# Patient Record
Sex: Male | Born: 1970
Health system: Southern US, Community
[De-identification: ages and names within clinical notes are randomized; demographics above are authoritative.]

---

## 2006-09-26 ENCOUNTER — Emergency Department: Payer: Self-pay | Admitting: Emergency Medicine

## 2006-09-26 ENCOUNTER — Other Ambulatory Visit: Payer: Self-pay

## 2008-02-19 ENCOUNTER — Ambulatory Visit: Payer: Self-pay | Admitting: Internal Medicine

## 2009-09-19 ENCOUNTER — Emergency Department: Payer: Self-pay | Admitting: Emergency Medicine

## 2017-12-28 ENCOUNTER — Ambulatory Visit: Payer: Self-pay | Admitting: Nurse Practitioner

## 2017-12-28 ENCOUNTER — Encounter: Payer: Self-pay | Admitting: Nurse Practitioner

## 2017-12-28 VITALS — BP 148/110 | HR 72 | Resp 16 | Ht >= 80 in | Wt 245.6 lb

## 2017-12-28 DIAGNOSIS — R030 Elevated blood-pressure reading, without diagnosis of hypertension: Secondary | ICD-10-CM

## 2017-12-28 DIAGNOSIS — L209 Atopic dermatitis, unspecified: Secondary | ICD-10-CM | POA: Insufficient documentation

## 2017-12-28 DIAGNOSIS — L237 Allergic contact dermatitis due to plants, except food: Secondary | ICD-10-CM

## 2017-12-28 DIAGNOSIS — B354 Tinea corporis: Secondary | ICD-10-CM

## 2017-12-28 MED ORDER — TRIAMCINOLONE ACETONIDE 0.025 % EX CREA
1.0000 | TOPICAL_CREAM | Freq: Two times a day (BID) | CUTANEOUS | 1 refills | Status: DC
Start: 2017-12-28 — End: 2019-05-20

## 2017-12-28 MED ORDER — MUPIROCIN 2 % EX OINT: TOPICAL_OINTMENT | CUTANEOUS | 1 refills | Status: DC

## 2017-12-28 MED ORDER — CLOTRIMAZOLE 1 % EX CREA: TOPICAL_CREAM | CUTANEOUS | 1 refills | Status: DC

## 2017-12-28 MED ORDER — PREDNISONE 10 MG (21) PO TBPK
ORAL_TABLET | ORAL | 0 refills | Status: DC
Start: 2017-12-28 — End: 2018-02-18

## 2017-12-28 NOTE — Progress Notes (Signed)
[facility] [address] [place], [place] [address]  Internal MEDICINE  Office Visit Note  Patient Name: Scott GrosserKevin Freeman  [id number]  [id number]  Date of Service: 12/28/2017  Chief Complaint  Patient presents with  . Poison Sumac    both wrists, left elbow, and abdomen     The patient is here for sick visit. He believes that he got into some poison sumak while doing some yard work. Started with very itchy and blistering rash on both wrists. Has gradually spread up the left arm. He has also noted a few new spots on the abdomen and on the upper legs. Itch terribly. Spots on the wrists are getting very tender. Some drainage noted from the rash on the wrists.   Pt is here for a sick visit.     Current Medication:  Outpatient Encounter Medications as of 12/28/2017  Medication Sig  . clotrimazole (LOTRIMIN) 1 % cream Apply to affected area in antecubital fossa BID for 10 days then as needed  . mupirocin ointment (BACTROBAN) 2 % Apply to affected areas on bilateral wrists BID for 10 days then as needed  . predniSONE (STERAPRED UNI-PAK 21 TAB) 10 MG (21) TBPK tablet 6 day taper - take by mouth as directed for 6 days  . triamcinolone (KENALOG) 0.025 % cream Apply 1 application topically 2 (two) times daily.   No facility-administered encounter medications on file as of 12/28/2017.       Medical History: History reviewed. No pertinent past medical history.   Today's Vitals   12/28/17 0916  BP: (!) 148/110  Pulse: 72  Resp: 16  SpO2: 99%  Weight: 245 lb 9.6 oz (111.4 kg)  Height: 6\' 9"  (2.057 m)    Review of Systems  Constitutional: Negative for activity change, chills, fatigue and unexpected weight change.  HENT: Negative for congestion, postnasal drip, rhinorrhea, sneezing and sore throat.   Eyes: Negative.  Negative for redness.  Respiratory: Negative for cough, chest tightness, shortness of breath and wheezing.   Cardiovascular: Negative for chest pain and  palpitations.       Elevated blood pressure today  Gastrointestinal: Negative for abdominal pain, constipation, diarrhea, nausea and vomiting.  Endocrine: Negative for cold intolerance, heat intolerance, polydipsia, polyphagia and polyuria.  Genitourinary: Negative.  Negative for dysuria and frequency.  Musculoskeletal: Negative for arthralgias, back pain, joint swelling and neck pain.  Skin: Positive for rash.       Blistering rash on the backs of the wrists. Fine red rash in crease of the left elbow.   Allergic/Immunologic: Negative for environmental allergies.  Neurological: Negative.  Negative for tremors and numbness.  Hematological: Negative for adenopathy. Does not bruise/bleed easily.  Psychiatric/Behavioral: Negative for behavioral problems (Depression), sleep disturbance and suicidal ideas. The patient is not nervous/anxious.     Physical Exam  Constitutional: He is oriented to person, place, and time. He appears well-developed and well-nourished. No distress.  HENT:  Head: Normocephalic and atraumatic.  Mouth/Throat: Oropharynx is clear and moist. No oropharyngeal exudate.  Eyes: Pupils are equal, round, and reactive to light. EOM are normal.  Neck: Normal range of motion. Neck supple. No JVD present. No tracheal deviation present. No thyromegaly present.  Cardiovascular: Normal rate, regular rhythm and normal heart sounds. Exam reveals no gallop and no friction rub.  No murmur heard. Pulmonary/Chest: Effort normal and breath sounds normal. No respiratory distress. He has no wheezes. He has no rales. He exhibits no tenderness.  Abdominal: Soft. Bowel sounds are  normal. There is no tenderness.  Musculoskeletal: Normal range of motion.  Lymphadenopathy:    He has no cervical adenopathy.  Neurological: He is alert and oriented to person, place, and time. No cranial nerve deficit.  Skin: Skin is warm and dry. Rash noted. He is not diaphoretic.     Psychiatric: He has a normal  mood and affect. His behavior is normal. Judgment and thought content normal.  Nursing note and vitals reviewed.  Assessment/Plan: 1. Poison oak dermatitis Recommend Zenfel wash for poison ivy/poison oak.  - triamcinolone (KENALOG) 0.025 % cream; Apply 1 application topically 2 (two) times daily.  Dispense: 80 g; Refill: 1 - predniSONE (STERAPRED UNI-PAK 21 TAB) 10 MG (21) TBPK tablet; 6 day taper - take by mouth as directed for 6 days  Dispense: 21 tablet; Refill: 0  2. Atopic dermatitis, unspecified type - triamcinolone (KENALOG) 0.025 % cream; Apply 1 application topically 2 (two) times daily.  Dispense: 80 g; Refill: 1 - mupirocin ointment (BACTROBAN) 2 %; Apply to affected areas on bilateral wrists BID for 10 days then as needed  Dispense: 22 g; Refill: 1  3. Tinea corporis - clotrimazole (LOTRIMIN) 1 % cream; Apply to affected area in antecubital fossa BID for 10 days then as needed  Dispense: 40 g; Refill: 1  4. Elevated blood pressure reading Likely due to discomfort of rash and lesions on arms. Will monitor closely.   General Counseling: Scott Freeman understanding of the findings of todays visit and agrees with plan of treatment. I have discussed any further diagnostic evaluation that may be needed or ordered today. We also reviewed his medications today. he has been encouraged to call the office with any questions or concerns that should arise related to todays visit.  This patient was seen by Vincent Gros, FNP- C in Collaboration with Dr Lyndon Code as a part of collaborative care agreement   Meds ordered this encounter  Medications  . triamcinolone (KENALOG) 0.025 % cream    Sig: Apply 1 application topically 2 (two) times daily.    Dispense:  80 g    Refill:  1    Order Specific Question:   Supervising Provider    Answer:   Lyndon Code [[id number]]  . predniSONE (STERAPRED UNI-PAK 21 TAB) 10 MG (21) TBPK tablet    Sig: 6 day taper - take by mouth as directed for 6  days    Dispense:  21 tablet    Refill:  0    Order Specific Question:   Supervising Provider    Answer:   Lyndon Code [[id number]]  . mupirocin ointment (BACTROBAN) 2 %    Sig: Apply to affected areas on bilateral wrists BID for 10 days then as needed    Dispense:  22 g    Refill:  1    Order Specific Question:   Supervising Provider    Answer:   Lyndon Code [[id number]]  . clotrimazole (LOTRIMIN) 1 % cream    Sig: Apply to affected area in antecubital fossa BID for 10 days then as needed    Dispense:  40 g    Refill:  1    Order Specific Question:   Supervising Provider    Answer:   Lyndon Code [[id number]]    Time spent: 15 Minutes

## 2018-02-18 ENCOUNTER — Encounter: Payer: Self-pay | Admitting: Nurse Practitioner

## 2018-02-18 ENCOUNTER — Ambulatory Visit: Payer: Self-pay | Admitting: Nurse Practitioner

## 2018-02-18 ENCOUNTER — Ambulatory Visit: Payer: BLUE CROSS/BLUE SHIELD | Admitting: Nurse Practitioner

## 2018-02-18 DIAGNOSIS — L237 Allergic contact dermatitis due to plants, except food: Secondary | ICD-10-CM

## 2018-02-18 MED ORDER — PREDNISONE 10 MG (21) PO TBPK
ORAL_TABLET | ORAL | 0 refills | Status: DC
Start: 2018-02-18 — End: 2018-02-26

## 2018-02-18 NOTE — Progress Notes (Signed)
[facility] [address] [place], [place] [address]  Internal MEDICINE  Office Visit Note  Patient Name: Scott Freeman  [id number]  [id number]  Date of Service: 02/18/2018  Chief Complaint  Patient presents with  . Poison Oak     The patient states that he was edging the fence around the yard of his home/office. Few remaining poison ivy/poison oak plants on the fence. Noted some small itchy lesions on Saturday evening. Starting to spread up the arms. Now noticing some on the lower legs as well. Has been trying not to scratch. These lesions are similar to poison ivy rash he has had in the past.   Pt is here for a sick visit.     Current Medication:  Outpatient Encounter Medications as of 02/18/2018  Medication Sig  . clotrimazole (LOTRIMIN) 1 % cream Apply to affected area in antecubital fossa BID for 10 days then as needed  . mupirocin ointment (BACTROBAN) 2 % Apply to affected areas on bilateral wrists BID for 10 days then as needed  . predniSONE (STERAPRED UNI-PAK 21 TAB) 10 MG (21) TBPK tablet 6 day taper - take by mouth as directed for 6 days  . triamcinolone (KENALOG) 0.025 % cream Apply 1 application topically 2 (two) times daily.  . [DISCONTINUED] predniSONE (STERAPRED UNI-PAK 21 TAB) 10 MG (21) TBPK tablet 6 day taper - take by mouth as directed for 6 days (Patient not taking: Reported on 02/18/2018)   No facility-administered encounter medications on file as of 02/18/2018.       Medical History: No past medical history on file.   Vital Signs: BP (!) 155/96   Pulse 66   Temp 98.2 F (36.8 C)   Resp 16   Ht  (2.057 m)   Wt 244 lb (110.7 kg)   SpO2 98%   BMI 26.15 kg/m    Review of Systems  Constitutional: Negative for activity change, chills, fatigue and unexpected weight change.  HENT: Negative for congestion, postnasal drip, rhinorrhea, sneezing and sore throat.   Eyes: Negative.  Negative for redness.  Respiratory: Negative for cough,  chest tightness, shortness of breath and wheezing.   Cardiovascular: Negative for chest pain and palpitations.       Elevated blood pressure today  Gastrointestinal: Negative for abdominal pain, constipation, diarrhea, nausea and vomiting.  Endocrine: Negative for cold intolerance, heat intolerance, polydipsia, polyphagia and polyuria.  Genitourinary: Negative.  Negative for dysuria and frequency.  Musculoskeletal: Negative for arthralgias, back pain, joint swelling and neck pain.  Skin: Positive for rash.       Widely dispersed red, round, lesions on the lower arms and lower legs. Very itchy. No evidence of blistering or infection at this time.   Allergic/Immunologic: Negative for environmental allergies.  Neurological: Negative.  Negative for tremors and numbness.  Hematological: Negative for adenopathy. Does not bruise/bleed easily.  Psychiatric/Behavioral: Negative for behavioral problems (Depression), sleep disturbance and suicidal ideas. The patient is not nervous/anxious.     Physical Exam  Constitutional: He is oriented to person, place, and time. He appears well-developed and well-nourished. No distress.  HENT:  Head: Normocephalic and atraumatic.  Mouth/Throat: Oropharynx is clear and moist. No oropharyngeal exudate.  Eyes: Pupils are equal, round, and reactive to light. EOM are normal.  Neck: Normal range of motion. Neck supple. No JVD present. No tracheal deviation present. No thyromegaly present.  Cardiovascular: Normal rate, regular rhythm and normal heart sounds. Exam reveals no gallop and no friction rub.  No murmur heard. Pulmonary/Chest: Effort normal. No respiratory distress. He has no wheezes. He has no rales. He exhibits no tenderness.  Abdominal: Soft. Bowel sounds are normal.  Musculoskeletal: Normal range of motion.  Lymphadenopathy:    He has no cervical adenopathy.  Neurological: He is alert and oriented to person, place, and time. No cranial nerve deficit.   Skin: Skin is warm and dry. Rash noted. He is not diaphoretic.  Widely dispersed maculopapular rash on the lower arms and lower legs. Comprised of small, red, round lesions. All currently intact with no drainage or evidence of infection present.   Psychiatric: He has a normal mood and affect. His behavior is normal. Judgment and thought content normal.  Nursing note and vitals reviewed.  Assessment/Plan: 1. Poison oak dermatitis Prednisone 6 day dose pack. Take as directed for 6 days. Use previously prescribed triamcinolone cream to help with itching and irritation.  - predniSONE (STERAPRED UNI-PAK 21 TAB) 10 MG (21) TBPK tablet; 6 day taper - take by mouth as directed for 6 days  Dispense: 21 tablet; Refill: 0  General Counseling: vishal sandlin understanding of the findings of todays visit and agrees with plan of treatment. I have discussed any further diagnostic evaluation that may be needed or ordered today. We also reviewed his medications today. he has been encouraged to call the office with any questions or concerns that should arise related to todays visit.  This patient was seen by Vincent Gros, FNP- C in Collaboration with Dr Lyndon Code as a part of collaborative care agreement    Meds ordered this encounter  Medications  . predniSONE (STERAPRED UNI-PAK 21 TAB) 10 MG (21) TBPK tablet    Sig: 6 day taper - take by mouth as directed for 6 days    Dispense:  21 tablet    Refill:  0    Order Specific Question:   Supervising Provider    Answer:   Lyndon Code [[id number]]    Time spent: 15 Minutes

## 2018-02-26 ENCOUNTER — Telehealth: Payer: Self-pay

## 2018-02-26 ENCOUNTER — Other Ambulatory Visit: Payer: Self-pay | Admitting: Nurse Practitioner

## 2018-02-26 DIAGNOSIS — L237 Allergic contact dermatitis due to plants, except food: Secondary | ICD-10-CM

## 2018-02-26 MED ORDER — PREDNISONE 10 MG (21) PO TBPK: ORAL_TABLET | ORAL | 0 refills | Status: DC

## 2018-02-26 NOTE — Progress Notes (Signed)
Patient called office today, c/o persistent poison ivy rash. Sent in second round prednisone 10mg, six day dose pack. Take as directed. Use hydrocortisone cream OTC to help alleviate itching and irritation. Did he try Xenfel wash?

## 2018-02-26 NOTE — Telephone Encounter (Signed)
Pt wife advised that we send pres to phar/np

## 2018-02-26 NOTE — Telephone Encounter (Signed)
Patient called office today, c/o persistent poison ivy rash. Sent in second round prednisone , six day dose pack. Take as directed. Use hydrocortisone cream OTC to help alleviate itching and irritation. Did he try Xenfel wash?

## 2018-12-20 ENCOUNTER — Emergency Department
Admission: EM | Admit: 2018-12-20 | Discharge: 2018-12-20 | Discharge status: Against Medical Advice | Payer: 59 | Attending: Emergency Medicine | Admitting: Emergency Medicine

## 2018-12-20 ENCOUNTER — Encounter: Payer: Self-pay | Admitting: *Deleted

## 2018-12-20 ENCOUNTER — Other Ambulatory Visit: Payer: Self-pay

## 2018-12-20 DIAGNOSIS — R509 Fever, unspecified: Secondary | ICD-10-CM | POA: Insufficient documentation

## 2018-12-20 DIAGNOSIS — Z5321 Procedure and treatment not carried out due to patient leaving prior to being seen by health care provider: Secondary | ICD-10-CM | POA: Diagnosis not present

## 2018-12-20 LAB — CBC WITH DIFFERENTIAL/PLATELET
ABS IMMATURE GRANULOCYTES: 0.03 10*3/uL (ref 0.00–0.07)
Basophils Absolute: 0 10*3/uL (ref 0.0–0.1)
Basophils Relative: 1 %
Eosinophils Absolute: 0.1 10*3/uL (ref 0.0–0.5)
Eosinophils Relative: 2 %
HCT: 44 % (ref 39.0–52.0)
Hemoglobin: 14.5 g/dL (ref 13.0–17.0)
Immature Granulocytes: 1 %
Lymphocytes Relative: 14 %
Lymphs Abs: 0.8 10*3/uL (ref 0.7–4.0)
MCH: 31.8 pg (ref 26.0–34.0)
MCHC: 33 g/dL (ref 30.0–36.0)
MCV: 96.5 fL (ref 80.0–100.0)
Monocytes Absolute: 0.7 10*3/uL (ref 0.1–1.0)
Monocytes Relative: 12 %
Neutro Abs: 4.1 10*3/uL (ref 1.7–7.7)
Neutrophils Relative %: 70 %
Platelets: 224 10*3/uL (ref 150–400)
RBC: 4.56 MIL/uL (ref 4.22–5.81)
RDW: 13.3 % (ref 11.5–15.5)
WBC: 5.8 10*3/uL (ref 4.0–10.5)
nRBC: 0 % (ref 0.0–0.2)

## 2018-12-20 LAB — COMPREHENSIVE METABOLIC PANEL
ALT: 35 U/L (ref 0–44)
AST: 23 U/L (ref 15–41)
Albumin: 4.6 g/dL (ref 3.5–5.0)
Alkaline Phosphatase: 74 U/L (ref 38–126)
Anion gap: 11 (ref 5–15)
BILIRUBIN TOTAL: 0.4 mg/dL (ref 0.3–1.2)
BUN: 15 mg/dL (ref 6–20)
CO2: 25 mmol/L (ref 22–32)
Calcium: 9.5 mg/dL (ref 8.9–10.3)
Chloride: 101 mmol/L (ref 98–111)
Creatinine, Ser: 1.21 mg/dL (ref 0.61–1.24)
GFR calc Af Amer: >60 mL/min (ref >60)
GFR calc non Af Amer: >60 mL/min (ref >60)
Glucose, Bld: 114 mg/dL — ABNORMAL HIGH (ref 70–99)
POTASSIUM: 3.9 mmol/L (ref 3.5–5.1)
Sodium: 137 mmol/L (ref 135–145)
Total Protein: 8.1 g/dL (ref 6.5–8.1)

## 2018-12-20 LAB — INFLUENZA PANEL BY PCR (TYPE A & B)
Influenza A By PCR: NEGATIVE
Influenza B By PCR: NEGATIVE

## 2018-12-20 MED ORDER — ACETAMINOPHEN 325 MG PO TABS
ORAL_TABLET | ORAL | Status: AC
  Filled 2018-12-20: qty 2

## 2018-12-20 MED ORDER — SODIUM CHLORIDE 0.9% FLUSH: 3.0000 mL | Freq: Once | INTRAVENOUS | Status: DC

## 2018-12-20 MED ORDER — ACETAMINOPHEN 325 MG PO TABS
650.0000 mg | ORAL_TABLET | Freq: Once | ORAL | Status: AC
  Administered 2018-12-20: 650 mg via ORAL

## 2018-12-20 NOTE — ED Notes (Signed)
Pt's and spouse to desk about wait ime. Pt's spouse stating that pt "is in excruciating pain and cannot wait anymore at this time". Pt and spouse leaving the ED at  This time with steady gait and in NAD.

## 2018-12-20 NOTE — ED Triage Notes (Signed)
Pt arrives with onset of fevers around 1900, dry cough, no n/v/d. Body aches. No meds for fever.

## 2018-12-23 ENCOUNTER — Telehealth (INDEPENDENT_AMBULATORY_CARE_PROVIDER_SITE_OTHER): Payer: 59 | Admitting: Internal Medicine

## 2018-12-23 ENCOUNTER — Ambulatory Visit (INDEPENDENT_AMBULATORY_CARE_PROVIDER_SITE_OTHER): Payer: 59 | Admitting: Internal Medicine

## 2018-12-23 ENCOUNTER — Ambulatory Visit: Payer: 59 | Admitting: Internal Medicine

## 2018-12-23 DIAGNOSIS — R059 Cough, unspecified: Secondary | ICD-10-CM

## 2018-12-23 DIAGNOSIS — J069 Acute upper respiratory infection, unspecified: Secondary | ICD-10-CM | POA: Diagnosis not present

## 2018-12-23 DIAGNOSIS — R509 Fever, unspecified: Secondary | ICD-10-CM

## 2018-12-23 DIAGNOSIS — R05 Cough: Secondary | ICD-10-CM | POA: Diagnosis not present

## 2018-12-23 MED ORDER — AZITHROMYCIN 250 MG PO TABS: ORAL_TABLET | ORAL | 0 refills | Status: DC

## 2018-12-23 NOTE — Telephone Encounter (Signed)
Spoke with patient concerning cough and fever, He went to ED, was tested for influenza which was negative, Pt was not seen at ED due to long wait time. Went home and took Claritin and Tylenol. He does have cough and minimum sob, He was able to carry out phone conversation without any pause. Does have allergies and pollen was bothering him. He denies any known  exposure to COVID-19. Spoke with wife as well. Following instruction are given 1. Rest and stay at home 2. Drink plenty of water. 3. Flonase and Claritin 4. Z-Pak called in

## 2018-12-24 ENCOUNTER — Ambulatory Visit: Payer: 59 | Admitting: Nurse Practitioner

## 2018-12-24 ENCOUNTER — Encounter: Payer: Self-pay | Admitting: Nurse Practitioner

## 2018-12-24 ENCOUNTER — Other Ambulatory Visit: Payer: Self-pay

## 2018-12-24 VITALS — BP 146/98 | HR 110 | Temp 101.9°F | Resp 16 | Ht >= 80 in | Wt 248.0 lb

## 2018-12-24 DIAGNOSIS — R05 Cough: Secondary | ICD-10-CM

## 2018-12-24 DIAGNOSIS — J069 Acute upper respiratory infection, unspecified: Secondary | ICD-10-CM

## 2018-12-24 DIAGNOSIS — R059 Cough, unspecified: Secondary | ICD-10-CM

## 2018-12-24 DIAGNOSIS — R509 Fever, unspecified: Secondary | ICD-10-CM | POA: Diagnosis not present

## 2018-12-24 LAB — POCT INFLUENZA A/B
Influenza A, POC: NEGATIVE
Influenza B, POC: NEGATIVE

## 2018-12-24 MED ORDER — HYDROCOD POLST-CPM POLST ER 10-8 MG/5ML PO SUER: 5.0000 mL | Freq: Two times a day (BID) | ORAL | 0 refills | Status: DC | PRN

## 2018-12-24 NOTE — Progress Notes (Signed)
Pt blood pressure is elevated informed provider Pt pulse elevated 1st reading 154/104 2nd reading 146/98

## 2018-12-24 NOTE — Progress Notes (Signed)
[facility] [address] [place], [place] [address]  Internal MEDICINE  Office Visit Note  Patient Name: Scott GrosserKevin Freeman  [id number]  [id number]  Date of Service: 12/25/2018   Pt is here for a sick visit.  Chief Complaint  Patient presents with  . Cough    pt has a bad cough, when talking, drank hot chocolate and it helped, has been going on for a little over a day or so   . Fever    saturday was 103, today was 99.     The patient c/o cough, congestion, and fever. Did go to ER on 3/20. Tested negative for flu. Was told to take OTC medication such as claritin and OTC cough suppressant. Was still feeling poorly yesterday. A z-pack was called in. Started this yesterday. Took two pills yesterday, one pill today. Feels better. No body aches or headache, or sore throat. Has severe cough without chest pain. Does get shortness of breath when coughing. Continues to have high fever. Does get better with tylenol.        Current Medication:  Outpatient Encounter Medications as of 12/24/2018  Medication Sig  . azithromycin (ZITHROMAX) 250 MG tablet Use as directed  . clotrimazole (LOTRIMIN) 1 % cream Apply to affected area in antecubital fossa BID for 10 days then as needed  . mupirocin ointment (BACTROBAN) 2 % Apply to affected areas on bilateral wrists BID for 10 days then as needed  . predniSONE (STERAPRED UNI-PAK 21 TAB) 10 MG (21) TBPK tablet 6 day taper - take by mouth as directed for 6 days  . triamcinolone (KENALOG) 0.025 % cream Apply 1 application topically 2 (two) times daily.  . chlorpheniramine-HYDROcodone (TUSSIONEX PENNKINETIC ER) 10-8 MG/5ML SUER Take 5 mLs by mouth every 12 (twelve) hours as needed for cough.   No facility-administered encounter medications on file as of 12/24/2018.       Medical History: History reviewed. No pertinent past medical history.   Today's Vitals   12/24/18 1541  BP: (!) 146/98  Pulse: (!) 110  Resp: 16  Temp: (!) 101.9 F  (38.8 C)  SpO2: 95%  Weight: 248 lb (112.5 kg)  Height: 6\' 9"  (2.057 m)   Body mass index is 26.58 kg/m.  Review of Systems  Constitutional: Positive for chills, fatigue and fever. Negative for unexpected weight change.  HENT: Positive for congestion and postnasal drip. Negative for rhinorrhea, sneezing and sore throat.   Respiratory: Negative for cough, chest tightness and shortness of breath.   Cardiovascular: Negative for chest pain and palpitations.  Gastrointestinal: Negative for abdominal pain, constipation, diarrhea, nausea and vomiting.  Musculoskeletal: Positive for myalgias. Negative for arthralgias, back pain, joint swelling and neck pain.  Skin: Negative for rash.  Neurological: Positive for headaches. Negative for tremors and numbness.  Hematological: Positive for adenopathy. Does not bruise/bleed easily.  Psychiatric/Behavioral: Negative for behavioral problems (Depression), sleep disturbance and suicidal ideas. The patient is not nervous/anxious.     Physical Exam Vitals signs and nursing note reviewed.  Constitutional:      General: He is not in acute distress.    Appearance: He is well-developed. He is ill-appearing. He is not diaphoretic.  HENT:     Head: Normocephalic and atraumatic.     Nose: Congestion present.     Right Turbinates: Swollen.     Left Turbinates: Swollen.     Mouth/Throat:     Pharynx: Posterior oropharyngeal erythema present. No oropharyngeal exudate.  Eyes:  Pupils: Pupils are equal, round, and reactive to light.  Neck:     Musculoskeletal: Normal range of motion and neck supple.     Thyroid: No thyromegaly.     Vascular: No JVD.     Trachea: No tracheal deviation.  Cardiovascular:     Rate and Rhythm: Normal rate and regular rhythm.     Heart sounds: Normal heart sounds. No murmur. No friction rub. No gallop.   Pulmonary:     Effort: Pulmonary effort is normal. No respiratory distress.     Breath sounds: Normal breath sounds. No  wheezing or rales.     Comments: Harsh, dry, non-productive cough present.  Chest:     Chest wall: No tenderness.  Abdominal:     General: Bowel sounds are normal.     Palpations: Abdomen is soft.  Musculoskeletal: Normal range of motion.  Lymphadenopathy:     Cervical: Cervical adenopathy present.  Skin:    General: Skin is warm and dry.  Neurological:     Mental Status: He is alert and oriented to person, place, and time.     Cranial Nerves: No cranial nerve deficit.  Psychiatric:        Behavior: Behavior normal.        Thought Content: Thought content normal.        Judgment: Judgment normal.   Assessment/Plan:  1. Acute upper respiratory infection Patient to finish antibiotics prescribed yesterday. He should rest and increase fluids. Continue to take OTC medication as needed and as indicated to alleviate symptoms.   2. Fever, unspecified fever cause Flu swab negative again today. Suspect COVID19. Reviewed current recommendations from [facility] --He should self-quarantine for 7 days and an additional 3 days after respiratory symptoms resolve. Everyone in his home should be vigilant about social distancing, and may benefit from self-quarantining for next 10 days. Take tylenol as needed for fever and headache. Contact PCP or ER if symptoms get worse or do not get better over next several days. Patient and his wife both understand recommendations.  - POCT Influenza A/B  3. Cough Attempted spirometry, however, poor quality due to severity and persistence of cough. Prescribed tussionex to take twice daily if needed for cough. Advised patient not to overuse this medicine and not to mix with other medications or alcohol as it can cause respiratory distress, sleepiness or dizziness. Should also avoid driving. Patient voiced understanding and agreement.  - Spirometry with Graph - POCT Influenza A/B - chlorpheniramine-HYDROcodone (TUSSIONEX PENNKINETIC ER) 10-8 MG/5ML SUER; Take 5 mLs by mouth  every 12 (twelve) hours as needed for cough.  Dispense: 115 mL; Refill: 0  General Counseling: Jasn verbalizes understanding of the findings of todays visit and agrees with plan of treatment. I have discussed any further diagnostic evaluation that may be needed or ordered today. We also reviewed his medications today. he has been encouraged to call the office with any questions or concerns that should arise related to todays visit.    Counseling:  Rest and increase fluids. Continue using OTC medication to control symptoms.   This patient was seen by Vincent Gros FNP Collaboration with Dr Lyndon Code as a part of collaborative care agreement  Orders Placed This Encounter  Procedures  . POCT Influenza A/B  . Spirometry with Graph    Meds ordered this encounter  Medications  . chlorpheniramine-HYDROcodone (TUSSIONEX PENNKINETIC ER) 10-8 MG/5ML SUER    Sig: Take 5 mLs by mouth every 12 (twelve) hours as needed for  cough.    Dispense:  115 mL    Refill:  0    Order Specific Question:   Supervising Provider    Answer:   Lyndon Code [[id number]]    Time spent: 30 Minutes

## 2018-12-25 DIAGNOSIS — J069 Acute upper respiratory infection, unspecified: Secondary | ICD-10-CM | POA: Insufficient documentation

## 2018-12-25 DIAGNOSIS — R05 Cough: Secondary | ICD-10-CM | POA: Insufficient documentation

## 2018-12-25 DIAGNOSIS — R509 Fever, unspecified: Secondary | ICD-10-CM | POA: Insufficient documentation

## 2018-12-25 DIAGNOSIS — R059 Cough, unspecified: Secondary | ICD-10-CM | POA: Insufficient documentation

## 2018-12-26 ENCOUNTER — Telehealth: Payer: Self-pay | Admitting: Internal Medicine

## 2018-12-26 NOTE — Telephone Encounter (Signed)
Spoke with his wife Pt is getting worse with cough, fever and has been very fatigued Pt has been seen in the office 3/24 Pt was advised to go to ED

## 2018-12-27 ENCOUNTER — Other Ambulatory Visit: Payer: Self-pay

## 2018-12-27 ENCOUNTER — Inpatient Hospital Stay
Admission: EM | Admit: 2018-12-27 | Discharge: 2019-01-02 | DRG: 177 | Disposition: A | Discharge status: Home / Self Care | Payer: 59 | Attending: Pulmonary Disease | Admitting: Pulmonary Disease

## 2018-12-27 ENCOUNTER — Encounter: Payer: Self-pay | Admitting: *Deleted

## 2018-12-27 ENCOUNTER — Emergency Department: Payer: 59

## 2018-12-27 DIAGNOSIS — R5383 Other fatigue: Secondary | ICD-10-CM | POA: Diagnosis not present

## 2018-12-27 DIAGNOSIS — A419 Sepsis, unspecified organism: Secondary | ICD-10-CM

## 2018-12-27 DIAGNOSIS — J189 Pneumonia, unspecified organism: Secondary | ICD-10-CM | POA: Diagnosis not present

## 2018-12-27 DIAGNOSIS — J9601 Acute respiratory failure with hypoxia: Secondary | ICD-10-CM | POA: Diagnosis not present

## 2018-12-27 DIAGNOSIS — Z833 Family history of diabetes mellitus: Secondary | ICD-10-CM

## 2018-12-27 DIAGNOSIS — D649 Anemia, unspecified: Secondary | ICD-10-CM | POA: Diagnosis present

## 2018-12-27 DIAGNOSIS — J9600 Acute respiratory failure, unspecified whether with hypoxia or hypercapnia: Secondary | ICD-10-CM

## 2018-12-27 DIAGNOSIS — J80 Acute respiratory distress syndrome: Secondary | ICD-10-CM | POA: Diagnosis not present

## 2018-12-27 DIAGNOSIS — Z800 Family history of malignant neoplasm of digestive organs: Secondary | ICD-10-CM

## 2018-12-27 DIAGNOSIS — N170 Acute kidney failure with tubular necrosis: Secondary | ICD-10-CM | POA: Diagnosis present

## 2018-12-27 DIAGNOSIS — J22 Unspecified acute lower respiratory infection: Secondary | ICD-10-CM | POA: Diagnosis not present

## 2018-12-27 DIAGNOSIS — R918 Other nonspecific abnormal finding of lung field: Secondary | ICD-10-CM | POA: Diagnosis not present

## 2018-12-27 DIAGNOSIS — Z720 Tobacco use: Secondary | ICD-10-CM | POA: Diagnosis not present

## 2018-12-27 DIAGNOSIS — R05 Cough: Secondary | ICD-10-CM | POA: Diagnosis not present

## 2018-12-27 DIAGNOSIS — N179 Acute kidney failure, unspecified: Secondary | ICD-10-CM | POA: Diagnosis not present

## 2018-12-27 DIAGNOSIS — R6520 Severe sepsis without septic shock: Secondary | ICD-10-CM | POA: Diagnosis not present

## 2018-12-27 DIAGNOSIS — J1289 Other viral pneumonia: Secondary | ICD-10-CM | POA: Diagnosis present

## 2018-12-27 DIAGNOSIS — R0902 Hypoxemia: Secondary | ICD-10-CM | POA: Diagnosis not present

## 2018-12-27 DIAGNOSIS — R0602 Shortness of breath: Secondary | ICD-10-CM | POA: Diagnosis not present

## 2018-12-27 DIAGNOSIS — B9729 Other coronavirus as the cause of diseases classified elsewhere: Secondary | ICD-10-CM | POA: Diagnosis not present

## 2018-12-27 DIAGNOSIS — R509 Fever, unspecified: Secondary | ICD-10-CM | POA: Diagnosis not present

## 2018-12-27 LAB — URINALYSIS, COMPLETE (UACMP) WITH MICROSCOPIC
Bacteria, UA: NONE SEEN
Bilirubin Urine: NEGATIVE
Glucose, UA: NEGATIVE mg/dL
Hgb urine dipstick: NEGATIVE
Ketones, ur: NEGATIVE mg/dL
Leukocytes,Ua: NEGATIVE
Nitrite: NEGATIVE
Protein, ur: NEGATIVE mg/dL
Specific Gravity, Urine: 1.019 (ref 1.005–1.030)
Squamous Epithelial / HPF: NONE SEEN (ref 0–5)
pH: 6 (ref 5.0–8.0)

## 2018-12-27 LAB — CBC WITH DIFFERENTIAL/PLATELET
Abs Immature Granulocytes: 0.01 10*3/uL (ref 0.00–0.07)
BASOS ABS: 0 10*3/uL (ref 0.0–0.1)
Basophils Relative: 0 %
Eosinophils Absolute: 0 10*3/uL (ref 0.0–0.5)
Eosinophils Relative: 0 %
HCT: 42.3 % (ref 39.0–52.0)
Hemoglobin: 14 g/dL (ref 13.0–17.0)
Immature Granulocytes: 0 %
Lymphocytes Relative: 31 %
Lymphs Abs: 1 10*3/uL (ref 0.7–4.0)
MCH: 31 pg (ref 26.0–34.0)
MCHC: 33.1 g/dL (ref 30.0–36.0)
MCV: 93.8 fL (ref 80.0–100.0)
Monocytes Absolute: 0.2 10*3/uL (ref 0.1–1.0)
Monocytes Relative: 7 %
NEUTROS PCT: 62 %
NRBC: 0 % (ref 0.0–0.2)
Neutro Abs: 2 10*3/uL (ref 1.7–7.7)
Platelets: 228 10*3/uL (ref 150–400)
RBC: 4.51 MIL/uL (ref 4.22–5.81)
RDW: 13.2 % (ref 11.5–15.5)
WBC: 3.3 10*3/uL — ABNORMAL LOW (ref 4.0–10.5)

## 2018-12-27 LAB — COMPREHENSIVE METABOLIC PANEL
ALT: 28 U/L (ref 0–44)
AST: 32 U/L (ref 15–41)
Albumin: 3.8 g/dL (ref 3.5–5.0)
Alkaline Phosphatase: 57 U/L (ref 38–126)
Anion gap: 11 (ref 5–15)
BUN: 13 mg/dL (ref 6–20)
CO2: 28 mmol/L (ref 22–32)
Calcium: 8.4 mg/dL — ABNORMAL LOW (ref 8.9–10.3)
Chloride: 98 mmol/L (ref 98–111)
Creatinine, Ser: 1.28 mg/dL — ABNORMAL HIGH (ref 0.61–1.24)
GFR calc Af Amer: >60 mL/min (ref >60)
GFR calc non Af Amer: >60 mL/min (ref >60)
Glucose, Bld: 108 mg/dL — ABNORMAL HIGH (ref 70–99)
POTASSIUM: 3.5 mmol/L (ref 3.5–5.1)
SODIUM: 137 mmol/L (ref 135–145)
Total Bilirubin: 0.6 mg/dL (ref 0.3–1.2)
Total Protein: 7.8 g/dL (ref 6.5–8.1)

## 2018-12-27 LAB — INFLUENZA PANEL BY PCR (TYPE A & B)
Influenza A By PCR: NEGATIVE
Influenza B By PCR: NEGATIVE

## 2018-12-27 LAB — LIPASE, BLOOD: Lipase: 27 U/L (ref 11–51)

## 2018-12-27 LAB — PROCALCITONIN: Procalcitonin: <0.1 ng/mL

## 2018-12-27 LAB — BRAIN NATRIURETIC PEPTIDE: B Natriuretic Peptide: 25 pg/mL (ref 0.0–100.0)

## 2018-12-27 LAB — LACTIC ACID, PLASMA: Lactic Acid, Venous: 1 mmol/L (ref 0.5–1.9)

## 2018-12-27 LAB — PROTIME-INR
INR: 0.9 (ref 0.8–1.2)
PROTHROMBIN TIME: 12.4 s (ref 11.4–15.2)

## 2018-12-27 MED ORDER — SODIUM CHLORIDE 0.9 % IV SOLN
2.0000 g | Freq: Once | INTRAVENOUS | Status: AC
  Administered 2018-12-27: 2 g via INTRAVENOUS
  Filled 2018-12-27: qty 20

## 2018-12-27 MED ORDER — VANCOMYCIN HCL 10 G IV SOLR
1500.0000 mg | Freq: Two times a day (BID) | INTRAVENOUS | Status: DC
  Administered 2018-12-28: 1500 mg via INTRAVENOUS
  Filled 2018-12-27 (×2): qty 1500

## 2018-12-27 MED ORDER — SODIUM CHLORIDE 0.9 % IV SOLN
2.0000 g | Freq: Three times a day (TID) | INTRAVENOUS | Status: DC
  Administered 2018-12-28 – 2018-12-30 (×7): 2 g via INTRAVENOUS
  Filled 2018-12-27 (×9): qty 2

## 2018-12-27 MED ORDER — SODIUM CHLORIDE 0.9 % IV BOLUS (SEPSIS)
2000.0000 mL | Freq: Once | INTRAVENOUS | Status: AC
  Administered 2018-12-27: 2000 mL via INTRAVENOUS

## 2018-12-27 MED ORDER — VANCOMYCIN HCL IN DEXTROSE 1-5 GM/200ML-% IV SOLN: 1000.0000 mg | Freq: Once | INTRAVENOUS | Status: DC

## 2018-12-27 MED ORDER — VANCOMYCIN HCL 10 G IV SOLR
2000.0000 mg | Freq: Once | INTRAVENOUS | Status: AC
  Administered 2018-12-27: 2000 mg via INTRAVENOUS
  Filled 2018-12-27: qty 2000

## 2018-12-27 MED ORDER — SODIUM CHLORIDE 0.9 % IV SOLN
500.0000 mg | Freq: Once | INTRAVENOUS | Status: AC
  Administered 2018-12-27: 500 mg via INTRAVENOUS
  Filled 2018-12-27: qty 500

## 2018-12-27 MED ORDER — ACETAMINOPHEN 325 MG PO TABS: 650.0000 mg | ORAL_TABLET | Freq: Once | ORAL | Status: DC

## 2018-12-27 MED ORDER — ACETAMINOPHEN 500 MG PO TABS
1000.0000 mg | ORAL_TABLET | Freq: Once | ORAL | Status: AC
  Administered 2018-12-27: 1000 mg via ORAL
  Filled 2018-12-27: qty 2

## 2018-12-27 NOTE — ED Notes (Signed)
Pt is diaphoretic and appears to be uncomfortable buit color is appropriate and cap refill is less than 3 seconds.

## 2018-12-27 NOTE — ED Notes (Signed)
Patient transported to X-ray 

## 2018-12-27 NOTE — ED Provider Notes (Signed)
[facility] Emergency Department Provider Note  ____________________________________________   First MD Initiated Contact with Patient 12/27/18 1957     (approximate)  I have reviewed the triage vital signs and the nursing notes.   HISTORY  Chief Complaint Fever and Cough     HPI Scott Freeman is a 48 y.o. male with medical history as listed below (no contributory past medical history) who presents for evaluation of gradually worsening symptoms for about a week.  This includes persistent fever, cough which is infrequently productive, increasing shortness of breath, general malaise, and body aches.  His symptoms started exactly a week ago and have steadily gotten worse.  He spoke with his primary care provider about 4 days ago and has been on 4 days of azithromycin but it does not help.  Symptoms are getting worse and are worse with exertion, slightly better with rest.  He does not have much appetite.  He denies neck pain and neck stiffness.  Although he has no known COVID-19 exposures, he is around people that do a lot of traveling  even though he has not personally travel to any high risk areas.  He has no history of lung disease.  No history of cardiac disease.  No history of blood clots in the legs nor the lungs.   History reviewed. No pertinent past medical history.  Patient Active Problem List   Diagnosis Date Noted  . Acute upper respiratory infection 12/25/2018  . Fever 12/25/2018  . Cough 12/25/2018  . Poison oak dermatitis 12/28/2017  . Atopic dermatitis 12/28/2017  . Tinea corporis 12/28/2017  . Elevated blood pressure reading 12/28/2017    History reviewed. No pertinent surgical history.  Prior to Admission medications   Medication Sig Start Date End Date Taking? Authorizing Provider  azithromycin (ZITHROMAX) 250 MG tablet Use as directed 12/23/18   Lyndon Code, MD  chlorpheniramine-HYDROcodone [facility] ER) 10-8 MG/5ML SUER  Take 5 mLs by mouth every 12 (twelve) hours as needed for cough. 12/24/18   Carlean Jews, NP  clotrimazole (LOTRIMIN) 1 % cream Apply to affected area in antecubital fossa BID for 10 days then as needed 12/28/17   Carlean Jews, NP  mupirocin ointment (BACTROBAN) 2 % Apply to affected areas on bilateral wrists BID for 10 days then as needed 12/28/17   Carlean Jews, NP  predniSONE (STERAPRED UNI-PAK 21 TAB) 10 MG (21) TBPK tablet 6 day taper - take by mouth as directed for 6 days 02/26/18   Carlean Jews, NP  triamcinolone (KENALOG) 0.025 % cream Apply 1 application topically 2 (two) times daily. 12/28/17   Carlean Jews, NP    Allergies Patient has no known allergies.  Family History  Problem Relation Age of Onset  . Diabetes Mother     Social History Social History   Tobacco Use  . Smoking status: Never Smoker  . Smokeless tobacco: Never Used  Substance Use Topics  . Alcohol use: Yes    Comment: occasionally   . Drug use: Never    Review of Systems Constitutional:    -- General malaise:  Yes    -- Myalgias/bodyaches:  Yes    -- Fever/chills:  Yes    --  Eyes: No visual changes. ENT:    --  Nasal congestion/runny nose:  No    --  Sore throat:  Yes    --  Earaches:  No  Cardiovascular:    --  Chest pain:  No  Worse with deep inspiration or movement:  not applicable   --  Syncope / passing out:  No  Respiratory:   --  Shortness of breath:  Yes    --  Cough:  Yes  Productive?:  Occasional   --  Wheezing:  No  Gastrointestinal:    --  Abdominal pain:  No    --  Nausea and/or vomiting:  No    --  Diarrhea:  No    --   Genitourinary: Negative for dysuria and hematuria. Musculoskeletal: No complaints at this time. Integumentary: Negative for rash, abrasions, and lacerations. Neurological:    --  Headache: Yes.    --  Focal weakness or numbness:  No Psychiatric: No acute concerns at this time.  ____________________________________________    PHYSICAL EXAM:  VITAL SIGNS: ED Triage Vitals  Enc Vitals Group     BP 12/27/18 1646 128/84     Pulse Rate 12/27/18 1646 99     Resp 12/27/18 1646 16     Temp 12/27/18 1646 (!) 101.7 F (38.7 C)     Temp Source 12/27/18 1646 Oral     SpO2 12/27/18 1646 96 %     Weight 12/27/18 1647 112.5 kg (248 lb)     Height 12/27/18 1647 2.057 m (6\' 9" )     Head Circumference --      Peak Flow --      Pain Score 12/27/18 1646 3     Pain Loc --      Pain Edu? --      Excl. in GC? --     Constitutional: Alert and oriented.  Ill-appearing but nontoxic. Eyes: Conjunctivae are normal.  Head: Atraumatic. Nose: No congestion/rhinnorhea. Mouth/Throat: Mucous membranes are moist. Neck: No stridor.  No meningeal signs.   Cardiovascular: No tachycardia, regular rhythm. Good peripheral circulation. Respiratory: No accessory muscle usage, no tachypnea, no wheezing.  88% SPO2 on room air at triage. Gastrointestinal: Soft and nontender. No distention.  Musculoskeletal: No lower extremity tenderness nor edema. No gross deformities of extremities. Neurologic:  Normal speech and language. No gross focal neurologic deficits are appreciated.  Skin:  Skin is warm, dry and intact. No rash noted. Psychiatric: Mood and affect are normal under the circumstances.  No acute/emergent concerns nor abnormalities.  ____________________________________________   LABS (all labs ordered are listed, but only abnormal results are displayed)  Labs Reviewed  COMPREHENSIVE METABOLIC PANEL - Abnormal; Notable for the following components:      Result Value   Glucose, Bld 108 (*)    Creatinine, Ser 1.28 (*)    Calcium 8.4 (*)    All other components within normal limits  CBC WITH DIFFERENTIAL/PLATELET - Abnormal; Notable for the following components:   WBC 3.3 (*)    All other components within normal limits  URINALYSIS, COMPLETE (UACMP) WITH MICROSCOPIC - Abnormal; Notable for the following components:   Color, Urine  YELLOW (*)    APPearance CLEAR (*)    All other components within normal limits  CULTURE, BLOOD (ROUTINE X 2)  CULTURE, BLOOD (ROUTINE X 2)  URINE CULTURE  NOVEL CORONAVIRUS, NAA (HOSPITAL ORDER, SEND-OUT TO REF LAB)  RESPIRATORY PANEL BY PCR  LACTIC ACID, PLASMA  LIPASE, BLOOD  BRAIN NATRIURETIC PEPTIDE  PROCALCITONIN  PROTIME-INR  LACTIC ACID, PLASMA  INFLUENZA PANEL BY PCR (TYPE A & B)   ____________________________________________  EKG  ED ECG REPORT I, Loleta Rose, the attending physician, personally viewed and interpreted this ECG.  Date: 12/27/2018 EKG  Time: 18: 36 Rate: 89 Rhythm: normal sinus rhythm QRS Axis: normal Intervals: normal ST/T Wave abnormalities: Inverted T waves in lead III, otherwise unremarkable. Narrative Interpretation: no evidence of acute ischemia  ____________________________________________  RADIOLOGY I, Loleta Rose, personally viewed and evaluated these images (plain radiographs) as part of my medical decision making, as well as reviewing the written report by the radiologist.  ED MD interpretation: Multifocal/multi lobar pneumonia.  Official radiology report(s): Dg Chest 1 View  Result Date: 12/27/2018 CLINICAL DATA:  Cough, fever and viral symptoms for over a week. Fever to 101.7 with dyspnea. Patient is exposed to other people traveled to [place] and [place]. EXAM: CHEST  1 VIEW COMPARISON:  None. FINDINGS: Borderline cardiomegaly. Nonaneurysmal thoracic aorta. Multifocal wedge shaped airspace disease with air bronchograms in the left mid to lower lung in particular with more confluent opacity in the right upper lobe are identified suspicious for multilobar pneumonia. Differential possibilities might include pulmonary infarcts, less likely viral pneumonia given the dense appearing consolidations. No acute osseous abnormality. IMPRESSION: Multilobar pulmonary consolidations with air bronchograms more likely to represent multifocal  bacterial pneumonia. Follow-up to resolution recommended. Electronically Signed   By: Tollie Eth M.D.   On: 12/27/2018 18:10    ____________________________________________   PROCEDURES   Procedure(s) performed (including Critical Care):  .Critical Care Performed by: Loleta Rose, MD Authorized by: Loleta Rose, MD   Critical care provider statement:    Critical care time (minutes):  30   Critical care time was exclusive of:  Separately billable procedures and treating other patients   Critical care was necessary to treat or prevent imminent or life-threatening deterioration of the following conditions:  Sepsis and respiratory failure   Critical care was time spent personally by me on the following activities:  Development of treatment plan with patient or surrogate, discussions with consultants, evaluation of patient's response to treatment, examination of patient, obtaining history from patient or surrogate, ordering and performing treatments and interventions, ordering and review of laboratory studies, ordering and review of radiographic studies, pulse oximetry, re-evaluation of patient's condition and review of old charts     ____________________________________________   INITIAL IMPRESSION / MDM / ASSESSMENT AND PLAN / ED COURSE  As part of my medical decision making, I reviewed the following data within the electronic MEDICAL RECORD NUMBER Nursing notes reviewed and incorporated, Labs reviewed , EKG interpreted , Old chart reviewed, Radiograph reviewed , Discussed with admitting physician (Dr. Nemiah Commander) and Notes from prior ED visits   Blu Lori was evaluated in Emergency Department on 12/27/2018 for the symptoms described in the history of present illness. He was evaluated in the context of the global COVID-19 pandemic, which necessitated consideration that the patient might be at risk for infection with the SARS-CoV-2 virus that causes COVID-19. Institutional protocols and  algorithms that pertain to the evaluation of patients at risk for COVID-19 are in a state of rapid change based on information released by regulatory bodies including the CDC and federal and state organizations. These policies and algorithms were followed during the patient's care in the ED.  ---------------------    Differential diagnosis includes, but is not limited to, viral illness, COVID-19, community-acquired pneumonia, less likely pulmonary embolism or ACS.  The patient is febrile and has a heart rate of greater than 90 upon arrival.  Additionally he is hypoxemic at 80% on room air and was placed on 2 L of oxygen by nasal cannula.  He meets sepsis criteria and I performed a full  sepsis work-up.  I also gave him 2 g of ceftriaxone IV, azithromycin 500 mg IV, and vancomycin 1 g IV given his recent influenza-like illness and concern for possible MRSA pneumonia.  His lab work is notable for a mild leukopenia of 3.3, slightly elevated creatinine of 1.28 (and I ordered 2 L of normal saline IV), a lactic acid of 1.0 which is reassuring and suggestive that he does not have severe sepsis, a normal pro calcitonin which is also reassuring.  Given the patient's contact with people to travel frequently, his multi lobar pneumonia in the setting of recent antibiotic treatment (azithromycin), his hypoxemia, and his meeting of sepsis criteria although not severe sepsis, I believe he needs admission.  I also discussed the case with Dr. Nemiah Commander with the hospitalist service and we agreed that he is high risk for COVID-19 given all of the signs and symptoms described above.  We have sent to coronavirus, respiratory viral panel, and influenza testing.  Of note the patient did test negative as an outpatient for influenza previously but I am repeating it for the sake of completion within our system (the 3 respiratory viral panels all use the same swab).  The patient understands and agrees with the plan.      ____________________________________________  FINAL CLINICAL IMPRESSION(S) / ED DIAGNOSES  Final diagnoses:  Sepsis, due to unspecified organism, unspecified whether acute organ dysfunction present [facility])  Multifocal pneumonia  Lower respiratory infection  Acute respiratory failure with hypoxemia (HCC)     MEDICATIONS GIVEN DURING THIS VISIT:  Medications  vancomycin (VANCOCIN) 2,000 mg in sodium chloride 0.9 % 500 mL IVPB (2,000 mg Intravenous New Bag/Given 12/27/18 1940)  acetaminophen (TYLENOL) tablet 1,000 mg (1,000 mg Oral Given 12/27/18 1724)  sodium chloride 0.9 % bolus 2,000 mL (2,000 mLs Intravenous New Bag/Given 12/27/18 1846)  cefTRIAXone (ROCEPHIN) 2 g in sodium chloride 0.9 % 100 mL IVPB (0 g Intravenous Stopped 12/27/18 1939)  azithromycin (ZITHROMAX) 500 mg in sodium chloride 0.9 % 250 mL IVPB (500 mg Intravenous New Bag/Given 12/27/18 1852)     ED Discharge Orders    None       Note:  This document was prepared using Dragon voice recognition software and may include unintentional dictation errors.   Loleta Rose, MD 12/27/18 2040

## 2018-12-27 NOTE — ED Notes (Signed)
ED TO INPATIENT HANDOFF REPORT  ED Nurse Name and Phone #: Idalia Needle [phone]/Rebecca [phone]  S Name/Age/Gender Scott Freeman 48 y.o. male Room/Bed: ED33A/ED33A  Code Status   Code Status: Not on file  Home/SNF/Other Home Patient oriented to: self, place, time and situation Is this baseline? Yes   Triage Complete: Triage complete  Chief Complaint Cough  Triage Note Pt to ED reporting cough, fever and viral symptoms for over a week. Pt has tested negative for the flu twice. Pt took tylenol at 1640 and continues to ave a fever of 101.7 in the tent. SOB reported after cough but no increased WOB noted at this time. Pt has been around people that traveled to [place] and to [place]. No traveling himself.    Allergies No Known Allergies  Level of Care/Admitting Diagnosis ED Disposition    ED Disposition Condition Comment   Admit  Hospital Area: [facility] [[id number]]  Level of Care: Med-Surg [16]  Diagnosis: Pneumonia [227785]  Admitting Physician: Enid Baas [[id number]]  Attending Physician: Enid Baas [[id number]]  Estimated length of stay: past midnight tomorrow  Certification:: I certify this patient will need inpatient services for at least 2 midnights  Bed request comments: COVID- high risk suspicion  PT Class (Do Not Modify): Inpatient [101]  PT Acc Code (Do Not Modify): Private [1]       B Medical/Surgery History History reviewed. No pertinent past medical history. History reviewed. No pertinent surgical history.   A IV Location/Drains/Wounds Patient Lines/Drains/Airways Status   Active Line/Drains/Airways    Name:   Placement date:   Placement time:   Site:   Days:   Peripheral IV 12/27/18 Left Antecubital   12/27/18    1845    Antecubital   less than 1   Peripheral IV 12/27/18 Left Forearm   12/27/18    1845    Forearm   less than 1          Intake/Output Last 24 hours No intake or output data in the 24 hours ending 12/27/18  2355  Labs/Imaging Results for orders placed or performed during the hospital encounter of 12/27/18 (from the past 48 hour(s))  Lactic acid, plasma     Status: None   Collection Time: 12/27/18  6:34 PM  Result Value Ref Range   Lactic Acid, Venous 1.0 0.5 - 1.9 mmol/L    Comment: Performed at [facility], [address] [place], [place] [address]  Comprehensive metabolic panel     Status: Abnormal   Collection Time: 12/27/18  6:34 PM  Result Value Ref Range   Sodium 137 135 - 145 mmol/L   Potassium 3.5 3.5 - 5.1 mmol/L   Chloride 98 98 - 111 mmol/L   CO2 28 22 - 32 mmol/L   Glucose, Bld 108 (H) 70 - 99 mg/dL   BUN 13 6 - 20 mg/dL   Creatinine, Ser 6.54 (H) 0.61 - 1.24 mg/dL   Calcium 8.4 (L) 8.9 - 10.3 mg/dL   Total Protein 7.8 6.5 - 8.1 g/dL   Albumin 3.8 3.5 - 5.0 g/dL   AST 32 15 - 41 U/L   ALT 28 0 - 44 U/L   Alkaline Phosphatase 57 38 - 126 U/L   Total Bilirubin 0.6 0.3 - 1.2 mg/dL   GFR calc non Af Amer >60 >60 mL/min   GFR calc Af Amer >60 >60 mL/min   Anion gap 11 5 - 15    Comment: Performed at [facility],  [address] [place], [place] [address]  Lipase, blood     Status: None   Collection Time: 12/27/18  6:34 PM  Result Value Ref Range   Lipase 27 11 - 51 U/L    Comment: Performed at [facility], [address] [place], [place] [address]  Brain natriuretic peptide - IF patient is dyspneic     Status: None   Collection Time: 12/27/18  6:34 PM  Result Value Ref Range   B Natriuretic Peptide 25.0 0.0 - 100.0 pg/mL    Comment: Performed at [facility], [address] [place], [place] [address]  CBC WITH DIFFERENTIAL     Status: Abnormal   Collection Time: 12/27/18  6:34 PM  Result Value Ref Range   WBC 3.3 (L) 4.0 - 10.5 K/uL   RBC 4.51 4.22 - 5.81 MIL/uL   Hemoglobin 14.0 13.0 - 17.0 g/dL   HCT 25.0 03.7 - 04.8 %   MCV 93.8 80.0 - 100.0 fL   MCH 31.0 26.0 - 34.0 pg   MCHC 33.1 30.0 - 36.0 g/dL   RDW 88.9 16.9  - 45.0 %   Platelets 228 150 - 400 K/uL   nRBC 0.0 0.0 - 0.2 %   Neutrophils Relative % 62 %   Neutro Abs 2.0 1.7 - 7.7 K/uL   Lymphocytes Relative 31 %   Lymphs Abs 1.0 0.7 - 4.0 K/uL   Monocytes Relative 7 %   Monocytes Absolute 0.2 0.1 - 1.0 K/uL   Eosinophils Relative 0 %   Eosinophils Absolute 0.0 0.0 - 0.5 K/uL   Basophils Relative 0 %   Basophils Absolute 0.0 0.0 - 0.1 K/uL   Immature Granulocytes 0 %   Abs Immature Granulocytes 0.01 0.00 - 0.07 K/uL    Comment: Performed at [facility], [address] [place], [place] [address]  Procalcitonin     Status: None   Collection Time: 12/27/18  6:34 PM  Result Value Ref Range   Procalcitonin <0.10 ng/mL    Comment:        Interpretation: PCT (Procalcitonin) <= 0.5 ng/mL: Systemic infection (sepsis) is not likely. Local bacterial infection is possible. (NOTE)       Sepsis PCT Algorithm           Lower Respiratory Tract                                      Infection PCT Algorithm    ----------------------------     ----------------------------         PCT < 0.25 ng/mL                PCT < 0.10 ng/mL         Strongly encourage             Strongly discourage   discontinuation of antibiotics    initiation of antibiotics    ----------------------------     -----------------------------       PCT 0.25 - 0.50 ng/mL            PCT 0.10 - 0.25 ng/mL               OR       >80% decrease in PCT            Discourage initiation of  antibiotics      Encourage discontinuation           of antibiotics    ----------------------------     -----------------------------         PCT >= 0.50 ng/mL              PCT 0.26 - 0.50 ng/mL               AND        <80% decrease in PCT             Encourage initiation of                                             antibiotics       Encourage continuation           of antibiotics    ----------------------------     -----------------------------         PCT >= 0.50 ng/mL                  PCT > 0.50 ng/mL               AND         increase in PCT                  Strongly encourage                                      initiation of antibiotics    Strongly encourage escalation           of antibiotics                                     -----------------------------                                           PCT <= 0.25 ng/mL                                                 OR                                        > 80% decrease in PCT                                     Discontinue / Do not initiate                                             antibiotics Performed at [facility], [address], [place], [place] [address]   Protime-INR     Status: None   Collection Time: 12/27/18  6:34  PM  Result Value Ref Range   Prothrombin Time 12.4 11.4 - 15.2 seconds   INR 0.9 0.8 - 1.2    Comment: (NOTE) INR goal varies based on device and disease states. Performed at [facility], [address] [place], [place] [address]   Urinalysis, Complete w Microscopic     Status: Abnormal   Collection Time: 12/27/18  6:34 PM  Result Value Ref Range   Color, Urine YELLOW (A) YELLOW   APPearance CLEAR (A) CLEAR   Specific Gravity, Urine 1.019 1.005 - 1.030   pH 6.0 5.0 - 8.0   Glucose, UA NEGATIVE NEGATIVE mg/dL   Hgb urine dipstick NEGATIVE NEGATIVE   Bilirubin Urine NEGATIVE NEGATIVE   Ketones, ur NEGATIVE NEGATIVE mg/dL   Protein, ur NEGATIVE NEGATIVE mg/dL   Nitrite NEGATIVE NEGATIVE   Leukocytes,Ua NEGATIVE NEGATIVE   RBC / HPF 0-5 0 - 5 RBC/hpf   WBC, UA 0-5 0 - 5 WBC/hpf   Bacteria, UA NONE SEEN NONE SEEN   Squamous Epithelial / LPF NONE SEEN 0 - 5   Mucus PRESENT     Comment: Performed at [facility], [address] [place], [place] [address]  Influenza panel by PCR (type A & B)     Status: None   Collection Time: 12/27/18  6:50 PM  Result Value Ref Range   Influenza A By PCR NEGATIVE NEGATIVE    Influenza B By PCR NEGATIVE NEGATIVE    Comment: Performed at [facility], [address] [place], [place] [address]   Dg Chest 1 View  Result Date: 12/27/2018 CLINICAL DATA:  Cough, fever and viral symptoms for over a week. Fever to 101.7 with dyspnea. Patient is exposed to other people traveled to [place] and [place]. EXAM: CHEST  1 VIEW COMPARISON:  None. FINDINGS: Borderline cardiomegaly. Nonaneurysmal thoracic aorta. Multifocal wedge shaped airspace disease with air bronchograms in the left mid to lower lung in particular with more confluent opacity in the right upper lobe are identified suspicious for multilobar pneumonia. Differential possibilities might include pulmonary infarcts, less likely viral pneumonia given the dense appearing consolidations. No acute osseous abnormality. IMPRESSION: Multilobar pulmonary consolidations with air bronchograms more likely to represent multifocal bacterial pneumonia. Follow-up to resolution recommended. Electronically Signed   By: Tollie Ethavid  Kwon M.D.   On: 12/27/2018 18:10    Pending Labs Unresulted Labs (From admission, onward)    Start     Ordered   12/27/18 1836  Respiratory Panel by PCR  (Respiratory virus panel with precautions)  Once,   STAT    Question:  Patient immune status  Answer:  Normal   12/27/18 1837   12/27/18 1835  Novel Coronavirus, NAA (hospital order; send-out to ref lab)  (Novel Coronavirus, NAA [facility](Hospital Order; send-out to ref lab) with precautions panel)  Once,   STAT    Comments:  recent travel to [place] and other areas at risk, persistent viral syndrome for >1 week, now with multilobar pneumonia. Discussed with admitting physician (Dr. Tobi BastosPyreddy) and ED physician (Dr. York CeriseForbach), agreed testing was appropriate.   Question Answer Comment  Current symptoms Fever and Shortness of breath   Excluded other viral illnesses Yes   Exposure Risk None   Patient immune status Normal      12/27/18 1837   12/27/18 1828   Blood Culture (routine x 2)  BLOOD CULTURE X 2,   STAT     12/27/18 1830   12/27/18 1828  Urine culture  Add-on,   AD     12/27/18 1830   Signed and Held  Novel Coronavirus, NAA (hospital order; send-out to ref lab)  ([facility] IP COVID ADMIT NOVEL CORONAVIRUS, NAA (HOSPITAL ORDER; SEND-OUT TO REF LAB))  Once,   R    Question Answer Comment  Current symptoms Fever and Cough   Excluded other viral illnesses Yes   Exposure Risk None      Signed and Held   Signed and Held  MRSA PCR Screening  Once,   R     Signed and Held          Vitals/Pain Today's Vitals   12/27/18 1813 12/27/18 1822 12/27/18 1840 12/27/18 2154  BP: 128/80  128/84 124/69  Pulse: 93  84 86  Resp: Temp: (!) 101.5 F (38.6 C)  100.3 F (37.9 C) (!) 101.6 F (38.7 C)  TempSrc: Oral  Oral Oral  SpO2: 93%  93% 99%  Weight:      Height:      PainSc:  3       Isolation Precautions Droplet and Contact precautions  Medications Medications  ceFEPIme (MAXIPIME) 2 g in sodium chloride 0.9 % 100 mL IVPB (has no administration in time range)  vancomycin (VANCOCIN) 1,500 mg in sodium chloride 0.9 % 500 mL IVPB (has no administration in time range)  acetaminophen (TYLENOL) tablet 1,000 mg (1,000 mg Oral Given 12/27/18 1724)  sodium chloride 0.9 % bolus 2,000 mL (0 mLs Intravenous Stopped 12/27/18 2121)  cefTRIAXone (ROCEPHIN) 2 g in sodium chloride 0.9 % 100 mL IVPB (0 g Intravenous Stopped 12/27/18 1939)  azithromycin (ZITHROMAX) 500 mg in sodium chloride 0.9 % 250 mL IVPB (0 mg Intravenous Stopped 12/27/18 2121)  vancomycin (VANCOCIN) 2,000 mg in sodium chloride 0.9 % 500 mL IVPB (0 mg Intravenous Stopped 12/27/18 2157)    Mobility walks Low fall risk   Focused Assessments Pulmonary Assessment Handoff:  Lung sounds: Bilateral Breath Sounds: Clear L Breath Sounds: Clear R Breath Sounds: Clear O2 Device: Room Air        R Recommendations: See Admitting Provider Note  Report given to:   Additional  Notes: PT is 6 ft 9 inches, may want to extend bed

## 2018-12-27 NOTE — ED Notes (Signed)
Pt ambulatory to BR at this time and given meal tray.

## 2018-12-27 NOTE — Progress Notes (Signed)
Pharmacy Antibiotic Note  Scott Freeman is a 48 y.o. male admitted on 12/27/2018 with pneumonia.  Pharmacy has been consulted for Vancomycin dosing.  Plan: Will dose 2000mg  as a loading dose and await hospitalist consult for further dosing  Height: 6\' 9"  (205.7 cm) Weight: 248 lb (112.5 kg) IBW/kg (Calculated) : 98.3  Temp (24hrs), Avg:101.2 F (38.4 C), Min:100.3 F (37.9 C), Max:101.7 F (38.7 C)  Recent Labs  Lab 12/20/18 2051 12/27/18 1834  WBC 5.8  --   CREATININE 1.21  --   LATICACIDVEN  --  1.0    Estimated Creatinine Clearance: 102.7 mL/min (by C-G formula based on SCr of 1.21 mg/dL).    No Known Allergies  Antimicrobials this admission: 3/27 Azithromycin >>  3/27 Ceftriaxone >>  3/27 Vancomycin >>  Dose adjustments this admission: None  Microbiology results: 3/27 BCx: pending 3/27 UCx: pending  3/27 Respiratory panel: pending  3/27 COVID-19: pending  Thank you for allowing pharmacy to be a part of this patient's care.  Albina Billet, PharmD, BCPS Clinical Pharmacist 12/27/2018 7:16 PM

## 2018-12-27 NOTE — Consult Note (Signed)
CODE SEPSIS - PHARMACY COMMUNICATION  **Broad Spectrum Antibiotics should be administered within 1 hour of Sepsis diagnosis**  Time Code Sepsis Called/Page Received: 6629  Antibiotics Ordered: Vancomycin/Ceftriaxone/Azithromycin  Time of 1st antibiotic administration: 1847  Additional action taken by pharmacy: None  If necessary, Name of Provider/Nurse Contacted: N/A   Albina Billet, PharmD, BCPS Clinical Pharmacist 12/27/2018 7:05 PM

## 2018-12-27 NOTE — ED Notes (Signed)
Pt updated on delay for admission. Pt given warm blankets and pillow at this time. No further concerns expressed by pt to this RN.

## 2018-12-27 NOTE — Progress Notes (Signed)
Pharmacy Antibiotic Note  Scott Freeman is a 48 y.o. male admitted on 12/27/2018 with sepsis.  Pharmacy has been consulted for Vancomycin, Cefepime dosing.  Plan: Ceftriaxone 2 gm IV X 1 given in ED on 3/27 @ 1939. Cefepime 2 gm IV Q8H ordered to start on 3/28 @ 0300.  Vancomycin 2000 mg IV X 1 given in ED on 3/27 @ 1940.  Vancomycin 1500 mg IV Q12H ordered to start 3/28 @ 0800.  AUC = 504 CrCl = 83 ml/min Ke = 0.073 hr-1 T1/2 = 9.4 hrs  No Vanc peak or trough ordered yet.   Height: 6\' 9"  (205.7 cm) Weight: 248 lb (112.5 kg) IBW/kg (Calculated) : 98.3  Temp (24hrs), Avg:101.2 F (38.4 C), Min:100.3 F (37.9 C), Max:101.7 F (38.7 C)  Recent Labs  Lab 12/27/18 1834  WBC 3.3*  CREATININE 1.28*  LATICACIDVEN 1.0    Estimated Creatinine Clearance: 97.1 mL/min (A) (by C-G formula based on SCr of 1.28 mg/dL (H)).    No Known Allergies  Antimicrobials this admission:   >>    >>   Dose adjustments this admission:   Microbiology results:  BCx:   UCx:    Sputum:    MRSA PCR:   Thank you for allowing pharmacy to be a part of this patient's care.  Rachelann Enloe D 12/27/2018 9:32 PM

## 2018-12-27 NOTE — ED Notes (Signed)
Pts fever has not responded to medication and oxygen saturation has decreased. No increased WOB noted but pt appears very uncomfortable at this time.

## 2018-12-27 NOTE — ED Triage Notes (Signed)
Pt to ED reporting cough, fever and viral symptoms for over a week. Pt has tested negative for the flu twice. Pt took tylenol at 1640 and continues to ave a fever of 101.7 in the tent. SOB reported after cough but no increased WOB noted at this time. Pt has been around people that traveled to [place] and to [place]. No traveling himself.

## 2018-12-27 NOTE — ED Notes (Signed)
ED Provider at bedside. 

## 2018-12-27 NOTE — H&P (Signed)
[facility] [place] [facility]   PATIENT NAME: Scott Freeman    MR#:  [id number]  DATE OF BIRTH:  [birth year]  DATE OF ADMISSION:  12/27/2018  PRIMARY CARE PHYSICIAN: Carlean Jews, NP   REQUESTING/REFERRING PHYSICIAN: Dr. Loleta Rose  CHIEF COMPLAINT:   Chief Complaint  Patient presents with  . Fever  . Cough    HISTORY OF PRESENT ILLNESS:  Scott Freeman  is a 48 y.o. male with no significant past medical history presents to hospital secondary to 1 week history of fevers and cough and shortness of breath. Patient denies any recent travel.  Has been working and been in contact with friends who have recently traveled to [place] and [place].  He has been having fevers, chills, body aches for almost a week now.  Was in the ER, flu test was done and it was negative last week.  He left from the ER due to long waiting time at the time.  Has been self isolating himself at home.  Since his symptoms did not improve and now he is having dry cough and shortness of breath, 2 days ago he was seen by nurse practitioner at his PCPs office and was started on azithromycin.  Flu test was repeated at the time which was negative again.  There was a suspicion for possible cold with and so he was asked to self isolate.  Both he and his wife came to the emergency room as patient's today.  His wife's complaints were more dizziness and hypotension whereas the patient continues to have fevers, cough and shortness of breath.  He was hypoxic with sats 88% on room air on presentation.  Had a fever of 104 F at home and currently at 38 F here. He is leukopenic, low procalcitonin level.  LFTs are within normal limits.  Chest x-ray showing multilobar pulmonary consolidations with air bronchograms, likely bacterial pneumonia. He is being treated for pneumonia, and also high suspicion for COVID 19.  PAST MEDICAL HISTORY:  History reviewed. No pertinent past medical history.  PAST SURGICAL  HISTORY:  History reviewed. No pertinent surgical history.  SOCIAL HISTORY:   Social History   Tobacco Use  . Smoking status: Never Smoker  . Smokeless tobacco: Never Used  Substance Use Topics  . Alcohol use: Yes    Comment: occasionally     FAMILY HISTORY:   Family History  Problem Relation Age of Onset  . Diabetes Mother   . Pancreatic cancer Father     DRUG ALLERGIES:  No Known Allergies  REVIEW OF SYSTEMS:   Review of Systems  Constitutional: Positive for chills, fever and malaise/fatigue. Negative for weight loss.  HENT: Negative for ear discharge, ear pain, hearing loss and nosebleeds.   Eyes: Negative for blurred vision, double vision and photophobia.  Respiratory: Positive for cough and shortness of breath. Negative for hemoptysis and wheezing.   Cardiovascular: Negative for chest pain, palpitations, orthopnea and leg swelling.  Gastrointestinal: Positive for nausea. Negative for abdominal pain, constipation, diarrhea, heartburn, melena and vomiting.  Genitourinary: Negative for dysuria, frequency, hematuria and urgency.  Musculoskeletal: Positive for myalgias. Negative for back pain and neck pain.  Skin: Negative for rash.  Neurological: Negative for dizziness, tingling, tremors, sensory change, speech change, focal weakness and headaches.  Endo/Heme/Allergies: Does not bruise/bleed easily.  Psychiatric/Behavioral: Negative for depression.    MEDICATIONS AT HOME:   Prior to Admission medications   Medication Sig Start Date End Date Taking? Authorizing Provider  azithromycin (ZITHROMAX) 250 MG tablet Use as directed 12/23/18   Lyndon Code, MD  chlorpheniramine-HYDROcodone [facility]) 10-8 MG/5ML SUER Take 5 mLs by mouth every 12 (twelve) hours as needed for cough. 12/24/18   Carlean Jews, NP  clotrimazole (LOTRIMIN) 1 % cream Apply to affected area in antecubital fossa BID for 10 days then as needed 12/28/17   Carlean Jews, NP   mupirocin ointment (BACTROBAN) 2 % Apply to affected areas on bilateral wrists BID for 10 days then as needed 12/28/17   Carlean Jews, NP  predniSONE (STERAPRED UNI-PAK 21 TAB) 10 MG (21) TBPK tablet 6 day taper - take by mouth as directed for 6 days 02/26/18   Carlean Jews, NP  triamcinolone (KENALOG) 0.025 % cream Apply 1 application topically 2 (two) times daily. 12/28/17   Carlean Jews, NP      VITAL SIGNS:  Blood pressure 128/84, pulse 84, temperature 100.3 F (37.9 C), temperature source Oral, resp. rate 18, height 6\' 9"  (2.057 m), weight 112.5 kg, SpO2 93 %.  PHYSICAL EXAMINATION:   Physical Exam  GENERAL:  48 y.o.-year-old patient lying in the bed, appears to be in distress secondary to breathing and cough. EYES: Pupils equal, round, reactive to light and accommodation. No scleral icterus. Extraocular muscles intact.  HEENT: Head atraumatic, normocephalic. Oropharynx and nasopharynx clear.  NECK:  Supple, no jugular venous distention. No thyroid enlargement, no tenderness.  LUNGS: Normal breath sounds bilaterally, no wheezing, rales,rhonchi or crepitation. No use of accessory muscles of respiration.  Decreased bibasilar breath sounds. CARDIOVASCULAR: S1, S2 normal. No murmurs, rubs, or gallops.  ABDOMEN: Soft, nontender, nondistended. Bowel sounds present. No organomegaly or mass.  EXTREMITIES: No pedal edema, cyanosis, or clubbing.  NEUROLOGIC: Cranial nerves II through XII are intact. Muscle strength 5/5 in all extremities. Sensation intact. Gait not checked.  PSYCHIATRIC: The patient is alert and oriented x 3.  SKIN: No obvious rash, lesion, or ulcer.   LABORATORY PANEL:   CBC Recent Labs  Lab 12/27/18 1834  WBC 3.3*  HGB 14.0  HCT 42.3  PLT 228   ------------------------------------------------------------------------------------------------------------------  Chemistries  Recent Labs  Lab 12/27/18 1834  NA 137  K 3.5  CL 98  CO2 28  GLUCOSE  108*  BUN 13  CREATININE 1.28*  CALCIUM 8.4*  AST 32  ALT 28  ALKPHOS 57  BILITOT 0.6   ------------------------------------------------------------------------------------------------------------------  Cardiac Enzymes No results for input(s): TROPONINI in the last 168 hours. ------------------------------------------------------------------------------------------------------------------  RADIOLOGY:  Dg Chest 1 View  Result Date: 12/27/2018 CLINICAL DATA:  Cough, fever and viral symptoms for over a week. Fever to 101.7 with dyspnea. Patient is exposed to other people traveled to [place] and [place]. EXAM: CHEST  1 VIEW COMPARISON:  None. FINDINGS: Borderline cardiomegaly. Nonaneurysmal thoracic aorta. Multifocal wedge shaped airspace disease with air bronchograms in the left mid to lower lung in particular with more confluent opacity in the right upper lobe are identified suspicious for multilobar pneumonia. Differential possibilities might include pulmonary infarcts, less likely viral pneumonia given the dense appearing consolidations. No acute osseous abnormality. IMPRESSION: Multilobar pulmonary consolidations with air bronchograms more likely to represent multifocal bacterial pneumonia. Follow-up to resolution recommended. Electronically Signed   By: Tollie Eth M.D.   On: 12/27/2018 18:10    EKG:   Orders placed or performed during the hospital encounter of 12/27/18  . EKG 12-Lead  . EKG 12-Lead    IMPRESSION AND PLAN:   Scott Freeman  is a 48 y.o. male with no significant past medical history presents to hospital secondary to 1 week history of fevers and cough and shortness of breath.  1.  Acute hypoxic respiratory Failure-chest x-ray with multilobar pneumonia. -High risk for COVID-19, with fevers, negative flu test, negative procalcitonin, leukopenia.  Negative respiratory virus panel -COVID-19 test has been sent out -Airborne and contact isolation -Reached out to ID on  call, they recommended that they do not need to be notified and infection prevention has to be notified. -Blood cultures, broad-spectrum antibiotics with vancomycin and cefepime for now -Symptomatic treatment for cough  2.  DVT prophylaxis-Lovenox   All the records are reviewed and case discussed with ED provider. Management plans discussed with the patient, family and they are in agreement.  CODE STATUS: Full Code  TOTAL TIME TAKING CARE OF THIS PATIENT: 55 minutes.    Enid Baas M.D on 12/27/2018 at 9:52 PM  Between 7am to 6pm - Pager - [phone]  After 6pm go to [email or web address] - Social research officer, government  [facility]  Office  [phone]  CC: Primary care physician; Carlean Jews, NP

## 2018-12-28 ENCOUNTER — Inpatient Hospital Stay: Payer: 59

## 2018-12-28 LAB — BLOOD GAS, ARTERIAL
Acid-Base Excess: 0.5 mmol/L (ref 0.0–2.0)
BICARBONATE: 24.6 mmol/L (ref 20.0–28.0)
FIO2: 0.28
O2 Saturation: 95.3 %
PO2 ART: 89 mmHg (ref 83.0–108.0)
Patient temperature: 39.6
pCO2 arterial: 37 mmHg (ref 32.0–48.0)
pH, Arterial: 7.39 (ref 7.350–7.450)

## 2018-12-28 LAB — RESPIRATORY PANEL BY PCR
Adenovirus: NOT DETECTED
Bordetella pertussis: NOT DETECTED
Chlamydophila pneumoniae: NOT DETECTED
Coronavirus 229E: NOT DETECTED
Coronavirus HKU1: NOT DETECTED
Coronavirus NL63: NOT DETECTED
Coronavirus OC43: NOT DETECTED
INFLUENZA A-RVPPCR: NOT DETECTED
Influenza B: NOT DETECTED
Metapneumovirus: NOT DETECTED
Mycoplasma pneumoniae: NOT DETECTED
PARAINFLUENZA VIRUS 4-RVPPCR: NOT DETECTED
Parainfluenza Virus 1: NOT DETECTED
Parainfluenza Virus 2: NOT DETECTED
Parainfluenza Virus 3: NOT DETECTED
Respiratory Syncytial Virus: NOT DETECTED
Rhinovirus / Enterovirus: NOT DETECTED

## 2018-12-28 LAB — MRSA PCR SCREENING: MRSA by PCR: NEGATIVE

## 2018-12-28 MED ORDER — TRAZODONE HCL 50 MG PO TABS
50.0000 mg | ORAL_TABLET | Freq: Every day | ORAL | Status: DC
  Administered 2018-12-28: 50 mg via ORAL
  Filled 2018-12-28: qty 1

## 2018-12-28 MED ORDER — IPRATROPIUM-ALBUTEROL 20-100 MCG/ACT IN AERS
1.0000 | INHALATION_SPRAY | Freq: Four times a day (QID) | RESPIRATORY_TRACT | Status: DC
  Administered 2018-12-28 – 2018-12-30 (×9): 1 via RESPIRATORY_TRACT
  Filled 2018-12-28: qty 4

## 2018-12-28 MED ORDER — ACETAMINOPHEN 325 MG PO TABS
650.0000 mg | ORAL_TABLET | Freq: Once | ORAL | Status: AC
  Administered 2018-12-28: 650 mg via ORAL
  Filled 2018-12-28: qty 2

## 2018-12-28 MED ORDER — ACETAMINOPHEN 325 MG PO TABS
ORAL_TABLET | ORAL | Status: AC
  Filled 2018-12-28: qty 2

## 2018-12-28 MED ORDER — DEXTROSE-NACL 5-0.45 % IV SOLN
INTRAVENOUS | Status: DC
  Administered 2018-12-28 – 2019-01-01 (×9): via INTRAVENOUS

## 2018-12-28 MED ORDER — SODIUM CHLORIDE 0.9% FLUSH
3.0000 mL | Freq: Two times a day (BID) | INTRAVENOUS | Status: DC
  Administered 2018-12-28 – 2019-01-02 (×10): 3 mL via INTRAVENOUS

## 2018-12-28 MED ORDER — IPRATROPIUM-ALBUTEROL 0.5-2.5 (3) MG/3ML IN SOLN: 3.0000 mL | Freq: Four times a day (QID) | RESPIRATORY_TRACT | Status: DC

## 2018-12-28 MED ORDER — HYDROCOD POLST-CPM POLST ER 10-8 MG/5ML PO SUER
5.0000 mL | Freq: Two times a day (BID) | ORAL | Status: DC
  Administered 2018-12-28 – 2018-12-29 (×4): 5 mL via ORAL
  Filled 2018-12-28 (×4): qty 5

## 2018-12-28 MED ORDER — ACETAMINOPHEN 325 MG PO TABS
650.0000 mg | ORAL_TABLET | Freq: Four times a day (QID) | ORAL | Status: DC | PRN
  Administered 2018-12-28 – 2018-12-30 (×7): 650 mg via ORAL
  Filled 2018-12-28 (×7): qty 2

## 2018-12-28 MED ORDER — ENOXAPARIN SODIUM 40 MG/0.4ML ~~LOC~~ SOLN
40.0000 mg | SUBCUTANEOUS | Status: DC
  Administered 2018-12-28: 40 mg via SUBCUTANEOUS
  Filled 2018-12-28: qty 0.4

## 2018-12-28 MED ORDER — ONDANSETRON HCL 4 MG PO TABS: 4.0000 mg | ORAL_TABLET | Freq: Four times a day (QID) | ORAL | Status: DC | PRN

## 2018-12-28 MED ORDER — ONDANSETRON HCL 4 MG/2ML IJ SOLN: 4.0000 mg | Freq: Four times a day (QID) | INTRAMUSCULAR | Status: DC | PRN

## 2018-12-28 MED ORDER — ENOXAPARIN SODIUM 40 MG/0.4ML ~~LOC~~ SOLN
40.0000 mg | SUBCUTANEOUS | Status: DC
  Administered 2018-12-28 – 2019-01-01 (×5): 40 mg via SUBCUTANEOUS
  Filled 2018-12-28 (×5): qty 0.4

## 2018-12-28 NOTE — Plan of Care (Signed)

## 2018-12-28 NOTE — Progress Notes (Signed)
1438--Temp 103.2oF oral; tylenol 650mg  PO given 1530--Temp 99.9oF. Pt states he has experienced 2 episodes of diarrhea.  Poor PO intake.  Good u/o. 1602--updated Dr. Katheren Shams regarding above.  New orders received for: ABG, PCXR, IV fluids, combivent inhaler.

## 2018-12-28 NOTE — Progress Notes (Signed)
Notified MD of temp. Orders placed. Will continue to monitor and assess

## 2018-12-28 NOTE — Plan of Care (Signed)
  Problem: Education: Goal: Knowledge of General Education information will improve Description Including pain rating scale, medication(s)/side effects and non-pharmacologic comfort measures Outcome: Progressing Note:  Education provided regarding the importance of increasing PO fluid intake, and performing DB&C exercises.   Problem: Health Behavior/Discharge Planning: Goal: Ability to manage health-related needs will improve Outcome: Progressing   Problem: Clinical Measurements: Goal: Ability to maintain clinical measurements within normal limits will improve Outcome: Progressing Goal: Will remain free from infection Outcome: Progressing Goal: Diagnostic test results will improve Outcome: Progressing Goal: Respiratory complications will improve Outcome: Progressing Goal: Cardiovascular complication will be avoided Outcome: Progressing   Problem: Activity: Goal: Risk for activity intolerance will decrease Outcome: Progressing   Problem: Nutrition: Goal: Adequate nutrition will be maintained Outcome: Progressing   Problem: Coping: Goal: Level of anxiety will decrease Outcome: Progressing   Problem: Elimination: Goal: Will not experience complications related to bowel motility Outcome: Progressing Goal: Will not experience complications related to urinary retention Outcome: Progressing   Problem: Pain Managment: Goal: General experience of comfort will improve Outcome: Progressing   Problem: Safety: Goal: Ability to remain free from injury will improve Outcome: Progressing   Problem: Skin Integrity: Goal: Risk for impaired skin integrity will decrease Outcome: Progressing   Problem: Activity: Goal: Ability to tolerate increased activity will improve Outcome: Progressing   Problem: Clinical Measurements: Goal: Ability to maintain a body temperature in the normal range will improve Outcome: Progressing   Problem: Respiratory: Goal: Ability to maintain  adequate ventilation will improve Outcome: Progressing Goal: Ability to maintain a clear airway will improve Outcome: Progressing

## 2018-12-28 NOTE — Progress Notes (Signed)
[facility] - [place] at [facility]   PATIENT NAME: Scott Freeman    MR#:  [id number]  DATE OF BIRTH:  1971/01/20  SUBJECTIVE:  CHIEF COMPLAINT:   Chief Complaint  Patient presents with  . Fever  . Cough  Patient states that he is feeling better, no events overnight per nursing staff, follow-up on cultures  REVIEW OF SYSTEMS:  CONSTITUTIONAL: No fever, fatigue or weakness.  EYES: No blurred or double vision.  EARS, NOSE, AND THROAT: No tinnitus or ear pain.  RESPIRATORY: No cough, shortness of breath, wheezing or hemoptysis.  CARDIOVASCULAR: No chest pain, orthopnea, edema.  GASTROINTESTINAL: No nausea, vomiting, diarrhea or abdominal pain.  GENITOURINARY: No dysuria, hematuria.  ENDOCRINE: No polyuria, nocturia,  HEMATOLOGY: No anemia, easy bruising or bleeding SKIN: No rash or lesion. MUSCULOSKELETAL: No joint pain or arthritis.   NEUROLOGIC: No tingling, numbness, weakness.  PSYCHIATRY: No anxiety or depression.   ROS  DRUG ALLERGIES:  No Known Allergies  VITALS:  Blood pressure 131/84, pulse 82, temperature 99.4 F (37.4 C), temperature source Oral, resp. rate 16, height 6\' 9"  (2.057 m), weight 112.4 kg, SpO2 93 %.  PHYSICAL EXAMINATION:  GENERAL:  48 y.o.-year-old patient lying in the bed with no acute distress.  EYES: Pupils equal, round, reactive to light and accommodation. No scleral icterus. Extraocular muscles intact.  HEENT: Head atraumatic, normocephalic. Oropharynx and nasopharynx clear.  NECK:  Supple, no jugular venous distention. No thyroid enlargement, no tenderness.  LUNGS: Normal breath sounds bilaterally, no wheezing, rales,rhonchi or crepitation. No use of accessory muscles of respiration.  CARDIOVASCULAR: S1, S2 normal. No murmurs, rubs, or gallops.  ABDOMEN: Soft, nontender, nondistended. Bowel sounds present. No organomegaly or mass.  EXTREMITIES: No pedal edema, cyanosis, or clubbing.  NEUROLOGIC: Cranial nerves II through XII are  intact. Muscle strength 5/5 in all extremities. Sensation intact. Gait not checked.  PSYCHIATRIC: The patient is alert and oriented x 3.  SKIN: No obvious rash, lesion, or ulcer.   Physical Exam LABORATORY PANEL:   CBC Recent Labs  Lab 12/27/18 1834  WBC 3.3*  HGB 14.0  HCT 42.3  PLT 228   ------------------------------------------------------------------------------------------------------------------  Chemistries  Recent Labs  Lab 12/27/18 1834  NA 137  K 3.5  CL 98  CO2 28  GLUCOSE 108*  BUN 13  CREATININE 1.28*  CALCIUM 8.4*  AST 32  ALT 28  ALKPHOS 57  BILITOT 0.6   ------------------------------------------------------------------------------------------------------------------  Cardiac Enzymes No results for input(s): TROPONINI in the last 168 hours. ------------------------------------------------------------------------------------------------------------------  RADIOLOGY:  Dg Chest 1 View  Result Date: 12/27/2018 CLINICAL DATA:  Cough, fever and viral symptoms for over a week. Fever to 101.7 with dyspnea. Patient is exposed to other people traveled to [place] and [place]. EXAM: CHEST  1 VIEW COMPARISON:  None. FINDINGS: Borderline cardiomegaly. Nonaneurysmal thoracic aorta. Multifocal wedge shaped airspace disease with air bronchograms in the left mid to lower lung in particular with more confluent opacity in the right upper lobe are identified suspicious for multilobar pneumonia. Differential possibilities might include pulmonary infarcts, less likely viral pneumonia given the dense appearing consolidations. No acute osseous abnormality. IMPRESSION: Multilobar pulmonary consolidations with air bronchograms more likely to represent multifocal bacterial pneumonia. Follow-up to resolution recommended. Electronically Signed   By: Tollie Eth M.D.   On: 12/27/2018 18:10    ASSESSMENT AND PLAN:  Scott Freeman  is a 48 y.o. male with no significant past medical  history presents to hospital secondary to 1 week history of  fevers and cough and shortness of breath.  *Acute hypoxic respiratory Failure Stable Most likely secondary to multilobar pneumonia Continue pneumonia protocol, empiric vancomycin/cefepime, follow-up on cultures  *High risk for COVID-19 Continue droplet/contact precautions, follow-up on outstanding test result negative flu test, negative procalcitonin, negative respiratory virus panel Reached out to ID on call, they recommended that they do not need to be notified and infection prevention has to be notified  DVT prophylaxis with Lovenox subcu  All the records are reviewed and case discussed with Care Management/Social Workerr. Management plans discussed with the patient, family and they are in agreement.  CODE STATUS: full  TOTAL TIME TAKING CARE OF THIS PATIENT: 35 minutes.    POSSIBLE D/C IN 1-3 DAYS, DEPENDING ON CLINICAL CONDITION.   Evelena Asa Jaymes Hang M.D on 12/28/2018   Between 7am to 6pm - Pager - [phone]  After 6pm go to [email or web address] - password [email or web address]  [facility]  Office  [phone]  CC: Primary care physician; Carlean Jews, NP  Note: This dictation was prepared with Dragon dictation along with smaller phrase technology. Any transcriptional errors that result from this process are unintentional.

## 2018-12-28 NOTE — Progress Notes (Signed)
This RN to patient room after he called out and his RN was unavailable. Patient VSS excluding temp 100.4, patient stated he had been up to bathroom and had made a "deposit" then started "feeling weak and a little dizzy," he states he has a "twinge in his chest" rated at a 2/10, states he does not want anything for it. This RN notified patient RN of patient concerns and status, patient in NAD at this time.

## 2018-12-29 LAB — C DIFFICILE QUICK SCREEN W PCR REFLEX
C Diff antigen: NEGATIVE
C Diff interpretation: NOT DETECTED
C Diff toxin: NEGATIVE

## 2018-12-29 LAB — URINE CULTURE: Culture: NO GROWTH

## 2018-12-29 MED ORDER — HYDROCOD POLST-CPM POLST ER 10-8 MG/5ML PO SUER
5.0000 mL | Freq: Two times a day (BID) | ORAL | Status: DC
  Administered 2018-12-29 – 2019-01-02 (×8): 5 mL via ORAL
  Filled 2018-12-29 (×8): qty 5

## 2018-12-29 MED ORDER — LACTINEX PO CHEW
1.0000 | CHEWABLE_TABLET | Freq: Three times a day (TID) | ORAL | Status: DC
  Filled 2018-12-29: qty 1

## 2018-12-29 MED ORDER — RISAQUAD PO CAPS
1.0000 | ORAL_CAPSULE | Freq: Three times a day (TID) | ORAL | Status: DC
  Administered 2018-12-29 – 2018-12-30 (×3): 1 via ORAL
  Filled 2018-12-29 (×5): qty 1

## 2018-12-29 MED ORDER — DIPHENOXYLATE-ATROPINE 2.5-0.025 MG PO TABS: 1.0000 | ORAL_TABLET | Freq: Four times a day (QID) | ORAL | Status: DC | PRN

## 2018-12-29 MED ORDER — TRAZODONE HCL 50 MG PO TABS
50.0000 mg | ORAL_TABLET | ORAL | Status: DC
  Administered 2018-12-29 – 2019-01-01 (×4): 50 mg via ORAL
  Filled 2018-12-29 (×4): qty 1

## 2018-12-29 MED ORDER — ENSURE ENLIVE PO LIQD
237.0000 mL | Freq: Three times a day (TID) | ORAL | Status: DC
  Administered 2018-12-30 – 2019-01-01 (×5): 237 mL via ORAL

## 2018-12-29 NOTE — Progress Notes (Addendum)
[employer] [facility] - [place] at [facility]   PATIENT NAME: Scott Freeman    MR#:  [id number]  DATE OF BIRTH:  [birth year]  SUBJECTIVE:  CHIEF COMPLAINT:   Chief Complaint  Patient presents with  . Fever  . Cough  Patient states that he is feeling better, no events overnight per nursing staff, follow-up on cultures, cxr stable, ABG nl  REVIEW OF SYSTEMS:  CONSTITUTIONAL: No fever, fatigue or weakness.  EYES: No blurred or double vision.  EARS, NOSE, AND THROAT: No tinnitus or ear pain.  RESPIRATORY: No cough, shortness of breath, wheezing or hemoptysis.  CARDIOVASCULAR: No chest pain, orthopnea, edema.  GASTROINTESTINAL: No nausea, vomiting, diarrhea or abdominal pain.  GENITOURINARY: No dysuria, hematuria.  ENDOCRINE: No polyuria, nocturia,  HEMATOLOGY: No anemia, easy bruising or bleeding SKIN: No rash or lesion. MUSCULOSKELETAL: No joint pain or arthritis.   NEUROLOGIC: No tingling, numbness, weakness.  PSYCHIATRY: No anxiety or depression.   ROS  DRUG ALLERGIES:  No Known Allergies  VITALS:  Blood pressure (!) 140/91, pulse 87, temperature (!) 102.2 F (39 C), temperature source Oral, resp. rate (!) 22, height 6\' 9"  (2.057 m), weight 112.4 kg, SpO2 97 %.  PHYSICAL EXAMINATION:  GENERAL:  48 y.o.-year-old patient lying in the bed with no acute distress.  EYES: Pupils equal, round, reactive to light and accommodation. No scleral icterus. Extraocular muscles intact.  HEENT: Head atraumatic, normocephalic. Oropharynx and nasopharynx clear.  NECK:  Supple, no jugular venous distention. No thyroid enlargement, no tenderness.  LUNGS: Normal breath sounds bilaterally, no wheezing, rales,rhonchi or crepitation. No use of accessory muscles of respiration.  CARDIOVASCULAR: S1, S2 normal. No murmurs, rubs, or gallops.  ABDOMEN: Soft, nontender, nondistended. Bowel sounds present. No organomegaly or mass.  EXTREMITIES: No pedal edema, cyanosis, or clubbing.  NEUROLOGIC:  Cranial nerves II through XII are intact. Muscle strength 5/5 in all extremities. Sensation intact. Gait not checked.  PSYCHIATRIC: The patient is alert and oriented x 3.  SKIN: No obvious rash, lesion, or ulcer.   Physical Exam LABORATORY PANEL:   CBC Recent Labs  Lab 12/27/18 1834  WBC 3.3*  HGB 14.0  HCT 42.3  PLT 228   ------------------------------------------------------------------------------------------------------------------  Chemistries  Recent Labs  Lab 12/27/18 1834  NA 137  K 3.5  CL 98  CO2 28  GLUCOSE 108*  BUN 13  CREATININE 1.28*  CALCIUM 8.4*  AST 32  ALT 28  ALKPHOS 57  BILITOT 0.6   ------------------------------------------------------------------------------------------------------------------  Cardiac Enzymes No results for input(s): TROPONINI in the last 168 hours. ------------------------------------------------------------------------------------------------------------------  RADIOLOGY:  Dg Chest 1 View  Result Date: 12/28/2018 CLINICAL DATA:  Fever and cough. EXAM: CHEST  1 VIEW COMPARISON:  December 27, 2018 FINDINGS: Bilateral pulmonary infiltrates most likely represent multifocal pneumonia. The heart, hila, mediastinum, lungs, and pleura are otherwise unremarkable. IMPRESSION: Bilateral multifocal infiltrates, likely pneumonia. Electronically Signed   By: Gerome Sam III M.D   On: 12/28/2018 19:27   Dg Chest 1 View  Result Date: 12/27/2018 CLINICAL DATA:  Cough, fever and viral symptoms for over a week. Fever to 101.7 with dyspnea. Patient is exposed to other people traveled to [place] and [place]. EXAM: CHEST  1 VIEW COMPARISON:  None. FINDINGS: Borderline cardiomegaly. Nonaneurysmal thoracic aorta. Multifocal wedge shaped airspace disease with air bronchograms in the left mid to lower lung in particular with more confluent opacity in the right upper lobe are identified suspicious for multilobar pneumonia. Differential possibilities  might include pulmonary infarcts, less likely  viral pneumonia given the dense appearing consolidations. No acute osseous abnormality. IMPRESSION: Multilobar pulmonary consolidations with air bronchograms more likely to represent multifocal bacterial pneumonia. Follow-up to resolution recommended. Electronically Signed   By: Tollie Eth M.D.   On: 12/27/2018 18:10    ASSESSMENT AND PLAN:  Scott Freeman  is a 48 y.o. male with no significant past medical history presents to hospital secondary to 1 week history of fevers and cough and shortness of breath.  *Acute hypoxic respiratory Failure Stable Most likely secondary to multilobar pneumonia Continue pneumonia protocol, empiric vancomycin/cefepime, follow-up on cultures  *High risk for COVID-19 Resolving Continue droplet/contact precautions, follow-up on outstanding test result negative flu test, negative procalcitonin, negative respiratory virus panel Reached out to ID on call, they recommended that they do not need to be notified and infection prevention has to be notified  DVT prophylaxis with Lovenox subcu  All the records are reviewed and case discussed with Care Management/Social Workerr. Management plans discussed with the patient, family and they are in agreement.  CODE STATUS: full  TOTAL TIME TAKING CARE OF THIS PATIENT: 30 minutes.    POSSIBLE D/C IN 1-2 DAYS, DEPENDING ON CLINICAL CONDITION.   Evelena Asa Salary M.D on 12/29/2018   Between 7am to 6pm - Pager - [phone]  After 6pm go to [email or web address] - password [email or web address]  [facility]  Office  [phone]  CC: Primary care physician; Carlean Jews, NP  Note: This dictation was prepared with Dragon dictation along with smaller phrase technology. Any transcriptional errors that result from this process are unintentional.

## 2018-12-29 NOTE — Progress Notes (Signed)
Initial Nutrition Assessment  RD working remotely.  DOCUMENTATION CODES:   Not applicable  INTERVENTION:  Provide Ensure Enlive po TID, each supplement provides 350 kcal and 20 grams of protein.   Encouraged adequate intake of calories and protein at meals.  NUTRITION DIAGNOSIS:   Inadequate oral intake related to decreased appetite as evidenced by per patient/family report.  GOAL:   Patient will meet greater than or equal to 90% of their needs  MONITOR:   PO intake, Supplement acceptance, Labs, Weight trends, I & O's  REASON FOR ASSESSMENT:   Malnutrition Screening Tool    ASSESSMENT:   48 year old male with no significant PMHx admitted with acute hypoxic respiratory failure most likely related to multilobar PNA, undergoing r/o for COVID-19.   Called patient's phone in room to discuss nutrition/weight history. He reports a decreased appetite and intake for the past 8 days. He typically has a good appetite and intake at baseline. He has been trying to drink water and take bites of soft foods such as crackers and soup. Today he had some chicken noodle soup. He reports he has felt a little nauseas but has not had emesis. He is amenable to drinking ONS to help meet calorie/protein needs.   Patient reports UBW is around 248 lbs. Weight from yesterday was 112.4 kg (247.8 lbs).   Medications reviewed and include: cefepime, D5-1/2NS at 100 mL/hr.  Labs reviewed.  NUTRITION - FOCUSED PHYSICAL EXAM:  Unable to complete at this time.  Diet Order:   Diet Order            Diet regular Room service appropriate? Yes; Fluid consistency: Thin  Diet effective now             EDUCATION NEEDS:   Education needs have been addressed  Skin:  Skin Assessment: Reviewed RN Assessment  Last BM:  12/29/2018 - large type 6  Height:   Ht Readings from Last 1 Encounters:  12/27/18 6\' 9"  (2.057 m)   Weight:   Wt Readings from Last 1 Encounters:  12/28/18 112.4 kg   Ideal Body  Weight:  105.5 kg  BMI:  Body mass index is 26.55 kg/m.  Estimated Nutritional Needs:   Kcal:  2400-2600  Protein:  120-140 grams  Fluid:  2.4-2.6 L/day  Helane Rima, MS, RD, LDN Office: [phone] Pager: [phone] After Hours/Weekend Pager: [phone]

## 2018-12-29 NOTE — Plan of Care (Signed)
Remains febrile this shift.  Tylenol po given as needed.  No further liquid stools this shift.  Cdiff specimen uncollected at this time.

## 2018-12-29 NOTE — Plan of Care (Signed)
Pt up to bathroom unassisted.  Oxygen saturations to 88% noted on monitor.  Pt reports oxygen removed while up to bathroom.  Pt notified to resumed oxygen on return to bed.  Oxygen tubing extended for use while up out of bed.

## 2018-12-29 NOTE — Progress Notes (Signed)
Desats into the 80's when he's out of bed.  Sats in the low 90's when he's in bed and on 2L [place].

## 2018-12-29 NOTE — Plan of Care (Signed)
States he's feeling better and his breathing seems easier.

## 2018-12-30 ENCOUNTER — Inpatient Hospital Stay: Payer: 59

## 2018-12-30 ENCOUNTER — Encounter: Payer: Self-pay | Admitting: *Deleted

## 2018-12-30 DIAGNOSIS — R918 Other nonspecific abnormal finding of lung field: Secondary | ICD-10-CM

## 2018-12-30 DIAGNOSIS — Z720 Tobacco use: Secondary | ICD-10-CM

## 2018-12-30 DIAGNOSIS — R509 Fever, unspecified: Secondary | ICD-10-CM

## 2018-12-30 DIAGNOSIS — R5383 Other fatigue: Secondary | ICD-10-CM

## 2018-12-30 DIAGNOSIS — R0602 Shortness of breath: Secondary | ICD-10-CM

## 2018-12-30 DIAGNOSIS — R05 Cough: Secondary | ICD-10-CM

## 2018-12-30 LAB — CBC
HEMATOCRIT: 37.2 % — AB (ref 39.0–52.0)
HEMOGLOBIN: 12.6 g/dL — AB (ref 13.0–17.0)
MCH: 31.5 pg (ref 26.0–34.0)
MCHC: 33.9 g/dL (ref 30.0–36.0)
MCV: 93 fL (ref 80.0–100.0)
Platelets: 271 10*3/uL (ref 150–400)
RBC: 4 MIL/uL — ABNORMAL LOW (ref 4.22–5.81)
RDW: 13.1 % (ref 11.5–15.5)
WBC: 4.5 10*3/uL (ref 4.0–10.5)
nRBC: 0 % (ref 0.0–0.2)

## 2018-12-30 LAB — BASIC METABOLIC PANEL
Anion gap: 10 (ref 5–15)
BUN: 9 mg/dL (ref 6–20)
CO2: 25 mmol/L (ref 22–32)
Calcium: 8.2 mg/dL — ABNORMAL LOW (ref 8.9–10.3)
Chloride: 101 mmol/L (ref 98–111)
Creatinine, Ser: 1.07 mg/dL (ref 0.61–1.24)
GFR calc Af Amer: >60 mL/min (ref >60)
GFR calc non Af Amer: >60 mL/min (ref >60)
GLUCOSE: 122 mg/dL — AB (ref 70–99)
Potassium: 3.3 mmol/L — ABNORMAL LOW (ref 3.5–5.1)
Sodium: 136 mmol/L (ref 135–145)

## 2018-12-30 LAB — MAGNESIUM: Magnesium: 2 mg/dL (ref 1.7–2.4)

## 2018-12-30 LAB — FERRITIN: Ferritin: 393 ng/mL — ABNORMAL HIGH (ref 24–336)

## 2018-12-30 LAB — LACTATE DEHYDROGENASE: LDH: 293 U/L — AB (ref 98–192)

## 2018-12-30 LAB — C-REACTIVE PROTEIN: CRP: 12.2 mg/dL — ABNORMAL HIGH (ref <1.0)

## 2018-12-30 MED ORDER — ORAL CARE MOUTH RINSE
15.0000 mL | Freq: Two times a day (BID) | OROMUCOSAL | Status: DC
  Administered 2018-12-30 – 2018-12-31 (×2): 15 mL via OROMUCOSAL

## 2018-12-30 MED ORDER — ZINC SULFATE 220 (50 ZN) MG PO CAPS: 220.0000 mg | ORAL_CAPSULE | Freq: Every day | ORAL | Status: DC

## 2018-12-30 MED ORDER — IOHEXOL 300 MG/ML  SOLN
75.0000 mL | Freq: Once | INTRAMUSCULAR | Status: AC | PRN
  Administered 2018-12-30: 75 mL via INTRAVENOUS

## 2018-12-30 MED ORDER — POTASSIUM CHLORIDE 20 MEQ PO PACK: 40.0000 meq | PACK | Freq: Once | ORAL | Status: DC

## 2018-12-30 MED ORDER — ZINC SULFATE 220 (50 ZN) MG PO CAPS
220.0000 mg | ORAL_CAPSULE | Freq: Once | ORAL | Status: AC
  Administered 2018-12-30: 220 mg via ORAL
  Filled 2018-12-30: qty 1

## 2018-12-30 MED ORDER — IPRATROPIUM-ALBUTEROL 0.5-2.5 (3) MG/3ML IN SOLN: 3.0000 mL | Freq: Four times a day (QID) | RESPIRATORY_TRACT | Status: DC

## 2018-12-30 MED ORDER — HYDROXYCHLOROQUINE SULFATE 200 MG PO TABS
200.0000 mg | ORAL_TABLET | Freq: Two times a day (BID) | ORAL | Status: DC
  Administered 2018-12-31 – 2019-01-02 (×4): 200 mg via ORAL
  Filled 2018-12-30 (×4): qty 1

## 2018-12-30 MED ORDER — HYDROXYCHLOROQUINE SULFATE 200 MG PO TABS
400.0000 mg | ORAL_TABLET | Freq: Once | ORAL | Status: AC
  Administered 2018-12-30: 400 mg via ORAL
  Filled 2018-12-30: qty 2

## 2018-12-30 MED ORDER — POTASSIUM CHLORIDE CRYS ER 20 MEQ PO TBCR
40.0000 meq | EXTENDED_RELEASE_TABLET | Freq: Once | ORAL | Status: AC
  Administered 2018-12-30: 40 meq via ORAL
  Filled 2018-12-30: qty 2

## 2018-12-30 MED ORDER — IPRATROPIUM-ALBUTEROL 20-100 MCG/ACT IN AERS
1.0000 | INHALATION_SPRAY | Freq: Four times a day (QID) | RESPIRATORY_TRACT | Status: DC
  Administered 2018-12-30 – 2019-01-02 (×9): 1 via RESPIRATORY_TRACT
  Filled 2018-12-30: qty 4

## 2018-12-30 MED ORDER — HYDROXYCHLOROQUINE SULFATE 200 MG PO TABS
400.0000 mg | ORAL_TABLET | Freq: Once | ORAL | Status: AC
  Administered 2018-12-31: 400 mg via ORAL
  Filled 2018-12-30: qty 2

## 2018-12-30 MED ORDER — DOXYCYCLINE HYCLATE 100 MG PO TABS
100.0000 mg | ORAL_TABLET | Freq: Two times a day (BID) | ORAL | Status: DC
  Administered 2018-12-30 – 2018-12-31 (×2): 100 mg via ORAL
  Filled 2018-12-30 (×2): qty 1

## 2018-12-30 MED ORDER — ALBUTEROL SULFATE (2.5 MG/3ML) 0.083% IN NEBU
2.5000 mg | INHALATION_SOLUTION | RESPIRATORY_TRACT | Status: DC | PRN
  Administered 2018-12-30: 2.5 mg via RESPIRATORY_TRACT
  Filled 2018-12-30: qty 3

## 2018-12-30 NOTE — Progress Notes (Signed)
Called into patient room around 1745.  Patient has just returned from the bathroom and was feeling very weak and SOB.  Satis after he returned and was laying down again were around 88%.  Oxygen increased to 4L [place].  Satus running between 88 - 92%.  Contacted Dr. Katheren Shams.  Orders for Duonebs Q 6H, and albuterol nebs prn.  Oxygen increased to 5L [place].  Albuterol Neb given.. A few minutes after neb delivered.  O2 sats 94% on 5L[place].

## 2018-12-30 NOTE — Progress Notes (Signed)
D/w ID, intensivist, nursing staff, given worsening respiratory status, concern for Covid - transfer to stepdown for further eval

## 2018-12-30 NOTE — Progress Notes (Signed)
Ch spoke w/ pt briefly over the phone. Pt shared that he hopes to be d/c soon and to be able to quarantine at home. Pt shared that he has improved. Pt is a CV-19 rule out.  No further needs at this time.    12/30/18 1315  Clinical Encounter Type  Visited With Patient  Visit Type Psychological support;Spiritual support;Social support  Spiritual Encounters  Spiritual Needs Emotional;Grief support  Stress Factors  Patient Stress Factors Health changes;Major life changes  Family Stress Factors None identified

## 2018-12-30 NOTE — Progress Notes (Signed)
eLink Physician-Brief Progress Note Patient Name: Scott Freeman DOB: Nov 23, 1970 MRN: [id number]   Date of Service  12/30/2018  HPI/Events of Note  48 yo male admitted with acute hypoxic respiratory failure secondary to possible COVID-19 vs. vape induced lung injury vs. atypical pneumonia. PCCM asked to assume care. VSS.  eICU Interventions  No new orders.      Intervention Category Evaluation Type: New Patient Evaluation  Lenell Antu 12/30/2018, 9:35 PM

## 2018-12-30 NOTE — Progress Notes (Signed)
[facility] [place] at [facility]   PATIENT NAME: Scott Freeman    MR#:  [id number]  DATE OF BIRTH:  [birth year]  SUBJECTIVE:  CHIEF COMPLAINT:   Chief Complaint  Patient presents with  . Fever  . Cough  Patient feeling slightly better, continues to spike high fevers, continue hypoxia with O2 requirement, CT of the chest concerning for atypical infection/viral infection, case discussed with infectious disease provider/Dr. Jyl Heinz is high for Covid 19  REVIEW OF SYSTEMS:  CONSTITUTIONAL: No fever, fatigue or weakness.  EYES: No blurred or double vision.  EARS, NOSE, AND THROAT: No tinnitus or ear pain.  RESPIRATORY: No cough, shortness of breath, wheezing or hemoptysis.  CARDIOVASCULAR: No chest pain, orthopnea, edema.  GASTROINTESTINAL: No nausea, vomiting, diarrhea or abdominal pain.  GENITOURINARY: No dysuria, hematuria.  ENDOCRINE: No polyuria, nocturia,  HEMATOLOGY: No anemia, easy bruising or bleeding SKIN: No rash or lesion. MUSCULOSKELETAL: No joint pain or arthritis.   NEUROLOGIC: No tingling, numbness, weakness.  PSYCHIATRY: No anxiety or depression.   ROS  DRUG ALLERGIES:  No Known Allergies  VITALS:  Blood pressure 122/83, pulse 79, temperature (!) 101.3 F (38.5 C), temperature source Oral, resp. rate 16, height 6\' 9"  (2.057 m), weight 112.4 kg, SpO2 93 %.  PHYSICAL EXAMINATION:  GENERAL:  48 y.o.-year-old patient lying in the bed with no acute distress.  EYES: Pupils equal, round, reactive to light and accommodation. No scleral icterus. Extraocular muscles intact.  HEENT: Head atraumatic, normocephalic. Oropharynx and nasopharynx clear.  NECK:  Supple, no jugular venous distention. No thyroid enlargement, no tenderness.  LUNGS: Normal breath sounds bilaterally, no wheezing, rales,rhonchi or crepitation. No use of accessory muscles of respiration.  CARDIOVASCULAR: S1, S2 normal. No murmurs, rubs, or gallops.  ABDOMEN: Soft,  nontender, nondistended. Bowel sounds present. No organomegaly or mass.  EXTREMITIES: No pedal edema, cyanosis, or clubbing.  NEUROLOGIC: Cranial nerves II through XII are intact. Muscle strength 5/5 in all extremities. Sensation intact. Gait not checked.  PSYCHIATRIC: The patient is alert and oriented x 3.  SKIN: No obvious rash, lesion, or ulcer.   Physical Exam LABORATORY PANEL:   CBC Recent Labs  Lab 12/30/18 0615  WBC 4.5  HGB 12.6*  HCT 37.2*  PLT 271   ------------------------------------------------------------------------------------------------------------------  Chemistries  Recent Labs  Lab 12/27/18 1834 12/30/18 0615  NA 137 136  K 3.5 3.3*  CL 98 101  CO2 28 25  GLUCOSE 108* 122*  BUN 13 9  CREATININE 1.28* 1.07  CALCIUM 8.4* 8.2*  MG  --  2.0  AST 32  --   ALT 28  --   ALKPHOS 57  --   BILITOT 0.6  --    ------------------------------------------------------------------------------------------------------------------  Cardiac Enzymes No results for input(s): TROPONINI in the last 168 hours. ------------------------------------------------------------------------------------------------------------------  RADIOLOGY:  Dg Chest 1 View  Result Date: 12/28/2018 CLINICAL DATA:  Fever and cough. EXAM: CHEST  1 VIEW COMPARISON:  December 27, 2018 FINDINGS: Bilateral pulmonary infiltrates most likely represent multifocal pneumonia. The heart, hila, mediastinum, lungs, and pleura are otherwise unremarkable. IMPRESSION: Bilateral multifocal infiltrates, likely pneumonia. Electronically Signed   By: Gerome Sam III M.D   On: 12/28/2018 19:27   Ct Chest W Contrast  Result Date: 12/30/2018 CLINICAL DATA:  Inpatient. Acute hypoxic respiratory failure. One week of fever, cough and dyspnea. High clinical suspicion for COVID-19, with pending COVID-19 test. EXAM: CT CHEST WITH CONTRAST TECHNIQUE: Multidetector CT imaging of the chest was performed during intravenous  contrast administration.  CONTRAST:  46mL OMNIPAQUE IOHEXOL 300 MG/ML  SOLN COMPARISON:  12/28/2018 chest radiograph. FINDINGS: Cardiovascular: Normal heart size. No significant pericardial effusion/thickening. Three-vessel coronary atherosclerosis. Great vessels are normal in course and caliber. No central pulmonary emboli. Mediastinum/Nodes: No discrete thyroid nodules. Unremarkable esophagus. No pathologically enlarged axillary, mediastinal or hilar lymph nodes. Lungs/Pleura: No pneumothorax. No pleural effusion. There is extensive patchy ground-glass opacity and intralobular septal thickening (crazy paving pattern) and patchy consolidation throughout both lungs involving all lung lobes, predominantly at the periphery of the lungs. No bronchiectasis. No discrete lung masses or significant pulmonary nodules. Upper abdomen: No acute abnormality. Musculoskeletal: No aggressive appearing focal osseous lesions. Mild thoracic spondylosis. IMPRESSION: 1. Extensive patchy ground-glass opacity and interlobular septal thickening (crazy paving pattern) and patchy consolidation throughout both lungs involving all lung lobes, predominantly at the periphery of the lungs. There are a spectrum of findings in the lungs which can be seen with acute atypical infection (as well as other non-infectious etiologies). In particular, viral pneumonia (including COVID-19) should be considered in the appropriate clinical setting. If there is clinical concern for acute pulmonary infection, consultation with Infectious Disease or Pulmonary Medicine is recommend. 2. Three-vessel coronary atherosclerosis. Electronically Signed   By: Delbert Phenix M.D.   On: 12/30/2018 10:39    ASSESSMENT AND PLAN:  Scott Freeman  is a 48 y.o. male with no significant past medical history presents to hospital secondary to 1 week history of fevers and cough and shortness of breath.  *Acute hypoxic respiratory Failure Stable Most likely secondary to  multilobar atypical pneumonia Continue pneumonia protocol, empiric cefepime, follow-up on cultures  *High risk for COVID-19 Stable Continue droplet/contact precautions, follow-up on outstanding test result negative flu test, negative procalcitonin, negative respiratory virus panel CT of the chest noted for atypical viral pneumonia bilaterally, case discussed with ID/Dr. Joylene Draft whom will see patient later today   DVT prophylaxis with Lovenox subcu  All the records are reviewed and case discussed with Care Management/Social Workerr. Management plans discussed with the patient, family and they are in agreement.  CODE STATUS: full  TOTAL TIME TAKING CARE OF THIS PATIENT: 35 minutes.    POSSIBLE D/C IN 1-3 DAYS, DEPENDING ON CLINICAL CONDITION.   Evelena Asa Estanislao Harmon M.D on 12/30/2018   Between 7am to 6pm - Pager - [phone]  After 6pm go to [email or web address] - password [email or web address]  [facility]  Office  [phone]  CC: Primary care physician; Carlean Jews, NP  Note: This dictation was prepared with Dragon dictation along with smaller phrase technology. Any transcriptional errors that result from this process are unintentional.

## 2018-12-30 NOTE — Consult Note (Addendum)
Name: Scott Freeman MRN: [id number] DOB: 07/14/1971    ADMISSION DATE:  12/27/2018 CONSULTATION DATE: 12/30/2018  REFERRING MD : Dr. Katheren Shams   CHIEF COMPLAINT: Shortness of Breath   BRIEF PATIENT DESCRIPTION:  48 yo male admitted with acute hypoxic respiratory failure secondary to possible COVID-19 vs. vape induced lung injury vs. atypical pneumonia   SIGNIFICANT EVENTS/STUDIES:  03/27-Pt admitted to telemetry unit with acute hypoxic respiratory failure  03/30-Pt transferred to the stepdown unit with worsening respiratory failure  03/30-CT Chest revealed extensive patchy ground-glass opacity and interlobular septal thickening (crazy paving pattern) and patchy consolidation throughout both lungs involving all lung lobes, predominantly at the periphery of the lungs. There are a spectrum of findings in the lungs which can be seen with acute atypical infection (as well as other non-infectious etiologies). In particular, viral pneumonia (including COVID-19) should be considered in the appropriate clinical setting. If there is clinical concern for acute pulmonary infection, consultation with Infectious Disease or Pulmonary Medicine is recommend. Three-vessel coronary atherosclerosis.  CULTURES: Blood x2 03/27>>negative Urine 03/27>>negative Respiratory panel 03/27>>negative  C diff 03/29>>negative   HISTORY OF PRESENT ILLNESS:   This is a 48 yo male with no significant medical history who presented to [facility] ER on 12/27/2018 with infrequent cough, worsening shortness of breath, fever, and flu-like symptoms onset of symptoms 2 weeks prior to presentation.   The pt reported he presented to the ER on 12/20/2018 due to symptoms, however due to wait time he left and went home. He later became febrile and took tylenol with some improvement of symptoms.  However due to worsening cough he saw an NP at his PCP office on 12/24/2018, flu test negative. At that time pt prescribed zpak and instructed to self  isolate.  His symptoms did not improve prompting current ER visit. 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  He does vape only to sample new products, but not on a routine basis. On average he vaped 10 times in the past 3 months and recently vaped a few weeks ago.  He does have 2 sons in college, he thinks his most recent contact with them was 2 weeks ago.  He last traveled to [facility] 4 weeks prior to presentation.  He also has had contact with acquaintances who traveled to [place] and [place], however they have not been sick.  He has had no known contact with COVID-19 pts.  In the ER vital signs revealed temp 102.9 and O2 sats 92% on RA.  Lab results revealed creatinine 1.28, lactic acid 1.0, wbc 3.3, influenza pcr negative, respiratory panel negative. CXR revealed multilobar pulmonary consolidations with air bronchograms more likely representing multifocal bacterial pneumonia.  Due to concerns of possible COVID-19 pt admitted to the telemetry unit by hospitalist team for additional workup and treatment.  On 12/30/2018 CT Chest concerning for extensive patchy ground-glass opacity and interlobular septal thickening (crazy paving pattern) and patchy consolidation throughout both lungs involving all lung lobes.  He later developed worsening respiratory failure requiring transfer to the stepdown unit on 12/30/2018.   PAST MEDICAL HISTORY :   has no past medical history on file.  has no past surgical history on file. Prior to Admission medications   Medication Sig Start Date End Date Taking? Authorizing Provider  azithromycin (ZITHROMAX) 250 MG tablet Use as directed 12/23/18  Yes Lyndon Code, MD  chlorpheniramine-HYDROcodone [facility]) 10-8 MG/5ML SUER Take 5 mLs by mouth every 12 (twelve) hours as needed  for cough. 12/24/18  Yes Carlean JewsBoscia, Heather E, NP  clotrimazole (LOTRIMIN) 1 % cream Apply to affected area in antecubital fossa BID for 10 days then as needed 12/28/17   Carlean JewsBoscia, Heather  E, NP  mupirocin ointment (BACTROBAN) 2 % Apply to affected areas on bilateral wrists BID for 10 days then as needed 12/28/17   Carlean JewsBoscia, Heather E, NP  predniSONE (STERAPRED UNI-PAK 21 TAB) 10 MG (21) TBPK tablet 6 day taper - take by mouth as directed for 6 days Patient not taking: Reported on 12/28/2018 02/26/18   Carlean JewsBoscia, Heather E, NP  triamcinolone (KENALOG) 0.025 % cream Apply 1 application topically 2 (two) times daily. 12/28/17   Carlean JewsBoscia, Heather E, NP   No Known Allergies  FAMILY HISTORY:  family history includes Diabetes in his mother; Pancreatic cancer in his father. SOCIAL HISTORY:  reports that he has never smoked. He has never used smokeless tobacco. He reports current alcohol use. He reports that he does not use drugs.  REVIEW OF SYSTEMS: Positives in BOLD  Constitutional: fever, chills, weight loss, malaise/fatigue and diaphoresis.  HENT: Negative for hearing loss, ear pain, nosebleeds, congestion, sore throat, neck pain, tinnitus and ear discharge.   Eyes: Negative for blurred vision, double vision, photophobia, pain, discharge and redness.  Respiratory: cough, hemoptysis, sputum production, shortness of breath, wheezing and stridor.   Cardiovascular: Negative for chest pain, palpitations, orthopnea, claudication, leg swelling and PND.  Gastrointestinal: Negative for heartburn, nausea, vomiting, abdominal pain, diarrhea, constipation, blood in stool and melena.  Genitourinary: Negative for dysuria, urgency, frequency, hematuria and flank pain.  Musculoskeletal: Negative for myalgias, back pain, joint pain and falls.  Skin: Negative for itching and rash.  Neurological: Negative for dizziness, tingling, tremors, sensory change, speech change, focal weakness, seizures, loss of consciousness, weakness and headaches.  Endo/Heme/Allergies: Negative for environmental allergies and polydipsia. Does not bruise/bleed easily.  SUBJECTIVE:  c/o cough   VITAL SIGNS: Temp:  [99.4 F (37.4  C)-101.3 F (38.5 C)] 100.1 F (37.8 C) (03/30 1752) Pulse Rate:  [79-105] 84 (03/30 1752) Resp:  [16-26] 26 (03/30 1859) BP: (122-137)/(73-83) 137/83 (03/30 1752) SpO2:  [88 %-97 %] 92 % (03/30 1859)  PHYSICAL EXAMINATION: General: well developed, well nourished male, NAD  Neuro: alert and oriented, follows commands  HEENT: supple, no JVD  Cardiovascular: nsr, rrr, no R/G  Lungs: clear throughout, even, non labored  Abdomen: +BS x4, soft, non tender, non distended  Musculoskeletal: normal bulk and tone, no edema  Skin: intact no rashes or lesions   Recent Labs  Lab 12/27/18 1834 12/30/18 0615  NA 137 136  K 3.5 3.3*  CL 98 101  CO2 28 25  BUN 13 9  CREATININE 1.28* 1.07  GLUCOSE 108* 122*   Recent Labs  Lab 12/27/18 1834 12/30/18 0615  HGB 14.0 12.6*  HCT 42.3 37.2*  WBC 3.3* 4.5  PLT 228 271   Ct Chest W Contrast  Result Date: 12/30/2018 CLINICAL DATA:  Inpatient. Acute hypoxic respiratory failure. One week of fever, cough and dyspnea. High clinical suspicion for COVID-19, with pending COVID-19 test. EXAM: CT CHEST WITH CONTRAST TECHNIQUE: Multidetector CT imaging of the chest was performed during intravenous contrast administration. CONTRAST:  75mL OMNIPAQUE IOHEXOL 300 MG/ML  SOLN COMPARISON:  12/28/2018 chest radiograph. FINDINGS: Cardiovascular: Normal heart size. No significant pericardial effusion/thickening. Three-vessel coronary atherosclerosis. Great vessels are normal in course and caliber. No central pulmonary emboli. Mediastinum/Nodes: No discrete thyroid nodules. Unremarkable esophagus. No pathologically enlarged axillary, mediastinal or hilar  lymph nodes. Lungs/Pleura: No pneumothorax. No pleural effusion. There is extensive patchy ground-glass opacity and intralobular septal thickening (crazy paving pattern) and patchy consolidation throughout both lungs involving all lung lobes, predominantly at the periphery of the lungs. No bronchiectasis. No discrete  lung masses or significant pulmonary nodules. Upper abdomen: No acute abnormality. Musculoskeletal: No aggressive appearing focal osseous lesions. Mild thoracic spondylosis. IMPRESSION: 1. Extensive patchy ground-glass opacity and interlobular septal thickening (crazy paving pattern) and patchy consolidation throughout both lungs involving all lung lobes, predominantly at the periphery of the lungs. There are a spectrum of findings in the lungs which can be seen with acute atypical infection (as well as other non-infectious etiologies). In particular, viral pneumonia (including COVID-19) should be considered in the appropriate clinical setting. If there is clinical concern for acute pulmonary infection, consultation with Infectious Disease or Pulmonary Medicine is recommend. 2. Three-vessel coronary atherosclerosis. Electronically Signed   By: Delbert Phenix M.D.   On: 12/30/2018 10:39    ASSESSMENT / PLAN:  Acute hypoxic respiratory failure secondary to possible COVID-19 vs. vape induced lung injury vs. atypical pneumonia  Supplemental O2 for dyspnea and/or hypoxia  Scheduled and prn bronchodilator therapy  ID consulted appreciate input-hydroxychloroquine per protocol 400mg  BID x 1st day and then 200mg  BID x 4 days Continue doxycycline  Trend WBC and monitor fever curve  Trend PCT  Follow cultures and await COVID-19 results  Fungitell, HIV, and urine legionella pneumophilia results pending  Maintain droplet and contact precautions   Anemia without obvious acute blood loss VTE px: subq lovenox  Trend CBC  Monitor for s/sx of bleeding and transfuse for hgb <7  Sonda Rumble, AGNP  Pulmonary/Critical Care Pager [phone] (please enter 7 digits) PCCM Consult Pager [phone] (please enter 7 digits)

## 2018-12-30 NOTE — Progress Notes (Signed)
On 2L [place] and at rest his Oxygen Sats are in the low 90's.  When he stands and moves around his sats drop to the low 80's, even when he keeps his oxygen on.  Oxygen increased to 3L [place].

## 2018-12-30 NOTE — Plan of Care (Signed)
  Problem: Clinical Measurements: Goal: Respiratory complications will improve Outcome: Progressing   Problem: Activity: Goal: Risk for activity intolerance will decrease Outcome: Progressing Note:  Up in room independently, tolerating well   Problem: Nutrition: Goal: Adequate nutrition will be maintained Outcome: Progressing   Problem: Coping: Goal: Level of anxiety will decrease Outcome: Progressing   Problem: Elimination: Goal: Will not experience complications related to urinary retention Outcome: Progressing   Problem: Pain Managment: Goal: General experience of comfort will improve Outcome: Progressing Note:  No complaints of pain this shift   Problem: Skin Integrity: Goal: Risk for impaired skin integrity will decrease Outcome: Progressing   Problem: Clinical Measurements: Goal: Ability to maintain a body temperature in the normal range will improve Outcome: Not Progressing Note:  Pt continues to have fevers throughout the shift, treated with tylenol, with relief for a period of time.

## 2018-12-30 NOTE — Consult Note (Addendum)
NAME: Scott Freeman  DOB: 04-24-1971  MRN: [id number]  Date/Time: 12/30/2018 11:02 AM  REQUESTING PROVIDER: Salary Subjective:  REASON FOR CONSULT: COVID suspect ? History from patient  Scott Freeman is a 48 y.o. male  With no significant PMH presents with 2 weeks of being unwell.  Pt says on March 20th he was feeling tired, fatigued, body aches , cough and came to the ED. As there was a wait time he left the ED and went home. HE had a flu test which was negative. He then started having temperature and took tylenol and was feeling better the next couple of days, but the cough got worse as he went out and then he called his PCP and zpac was called in on 12/24/18. As he was not feeling better he came ot the ED on 3/27. 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 His last travel was to [facility] 4 weeks ago. He has acquaintance who traveled to [place] but they have not been sick. He has no known contact with Covid 19 patinet. He has vaped 10 times in the pastt 3 months and his last vaping has been a few weeks ago. He vapes only to sample his new products but not on a routine basis He has 2 sons and they are in college- one at [employer] and the other in [employer]. He does not remember exactly when his sons visited him but thinks the [facility] guy could have visited him 2 weeks ago. His wife has addison, hashmito's disease and is not having any fever or cough. He has a dog at home Says he is a runner      PMH none  PSH- bone spurs surgery  SH Lives with wife Non smoker  Family History  Problem Relation Age of Onset  . Diabetes Mother   . Pancreatic cancer Father    No Known Allergies  ? Current Facility-Administered Medications  Medication Dose Route Frequency Provider Last Rate Last Dose  . acetaminophen (TYLENOL) tablet 650 mg  650 mg Oral Q6H PRN Enid Baas, MD   650 mg at  12/30/18 0113  . acidophilus (RISAQUAD) capsule 1 capsule  1 capsule Oral TID WC Albina Billet, RPH   1 capsule at 12/30/18 8182  . ceFEPIme (MAXIPIME) 2 g in sodium chloride 0.9 % 100 mL IVPB  2 g Intravenous Q8H Enid Baas, MD 200 mL/hr at 12/30/18 0445 2 g at 12/30/18 0445  . chlorpheniramine-HYDROcodone (TUSSIONEX) 10-8 MG/5ML suspension 5 mL  5 mL Oral Q12H Bertram Savin, RPH   5 mL at 12/30/18 9937  . dextrose 5 %-0.45 % sodium chloride infusion   Intravenous Continuous Salary, Evelena Asa, MD   Stopped at 12/30/18 0445  . diphenoxylate-atropine (LOMOTIL) 2.5-0.025 MG per tablet 1 tablet  1 tablet Oral QID PRN Salary, Montell D, MD      . enoxaparin (LOVENOX) injection 40 mg  40 mg Subcutaneous Q24H Bertram Savin, RPH   40 mg at 12/29/18 1952  . feeding supplement (ENSURE ENLIVE) (ENSURE ENLIVE) liquid 237 mL  237 mL Oral TID BM Salary, Montell D, MD      . Ipratropium-Albuterol (COMBIVENT) respimat 1 puff  1 puff Inhalation Q6H Maxcine Ham Daggett, RPH   1 puff at 12/30/18 1696  . MEDLINE mouth rinse  15 mL Mouth Rinse BID Salary, Montell  D, MD      . ondansetron (ZOFRAN) tablet 4 mg  4 mg Oral Q6H PRN Enid Baas, MD       Or  . ondansetron (ZOFRAN) injection 4 mg  4 mg Intravenous Q6H PRN Enid Baas, MD      . potassium chloride (KLOR-CON) packet 40 mEq  40 mEq Oral Once Salary, Montell D, MD      . sodium chloride flush (NS) 0.9 % injection 3 mL  3 mL Intravenous Q12H Salary, Montell D, MD   3 mL at 12/30/18 0981  . traZODone (DESYREL) tablet 50 mg  50 mg Oral Q24H Bertram Savin, RPH   50 mg at 12/29/18 1953     Abtx:  Anti-infectives (From admission, onward)   Start     Dose/Rate Route Frequency Ordered Stop   12/28/18 0800  vancomycin (VANCOCIN) 1,500 mg in sodium chloride 0.9 % 500 mL IVPB  Status:  Discontinued     1,500 mg 250 mL/hr over 120 Minutes Intravenous Every 12 hours 12/27/18 2132 12/28/18 1036   12/28/18 0300  ceFEPIme  (MAXIPIME) 2 g in sodium chloride 0.9 % 100 mL IVPB     2 g 200 mL/hr over 30 Minutes Intravenous Every 8 hours 12/27/18 2118     12/27/18 1845  vancomycin (VANCOCIN) IVPB 1000 mg/200 mL premix  Status:  Discontinued     1,000 mg 200 mL/hr over 60 Minutes Intravenous  Once 12/27/18 1830 12/27/18 1833   12/27/18 1845  cefTRIAXone (ROCEPHIN) 2 g in sodium chloride 0.9 % 100 mL IVPB     2 g 200 mL/hr over 30 Minutes Intravenous  Once 12/27/18 1830 12/27/18 1939   12/27/18 1845  azithromycin (ZITHROMAX) 500 mg in sodium chloride 0.9 % 250 mL IVPB     500 mg 250 mL/hr over 60 Minutes Intravenous  Once 12/27/18 1830 12/27/18 2121   12/27/18 1845  vancomycin (VANCOCIN) 2,000 mg in sodium chloride 0.9 % 500 mL IVPB     2,000 mg 250 mL/hr over 120 Minutes Intravenous  Once 12/27/18 1833 12/27/18 2157      REVIEW OF SYSTEMS:  Const:  fever,  chills, negative weight loss Eyes: negative diplopia or visual changes, negative eye pain ENT: negative coryza, negative sore throat Resp:  Cough,no  hemoptysis, ++ dyspnea Cards:  chest pain with coughing , no palpitations, lower extremity edema GU: negative for frequency, dysuria and hematuria GI: Negative for abdominal pain, diarrhea, bleeding, constipation Skin: negative for rash and pruritus Heme: negative for easy bruising and gum/nose bleeding MS:  myalgias,, body ache and fatigue Neurolo:negative for headaches, dizziness, vertigo, memory problems  Psych: negative for feelings of anxiety, depression  Endocrine:no polyuria or polydipsia Allergy/Immunology- negative for any medication or food allergies ?  Objective:  VITALS:  BP 122/83   Pulse 79   Temp (!) 101.3 F (38.5 C) (Oral)   Resp 16   Ht  (2.057 m)   Wt 112.4 kg   SpO2 92%   BMI 26.55 kg/m  PHYSICAL EXAM:  General: Alert, cooperative, no distress, appears stated age.  Pulse OX drops to 86% on moving or coughing- otherwise it is 98% on 4 l oxygen  Pt was ambulating in the  room to go to the bathroom Tattos arms CNS -Non Focal HS s1s2 Heart rate 80.mt-regular  Showed him CT chest and explained the findings  Pertinent Labs Lab Results CBC    Component Value Date/Time   WBC 4.5 12/30/2018 0615  RBC 4.00 (L) 12/30/2018 0615   HGB 12.6 (L) 12/30/2018 0615   HCT 37.2 (L) 12/30/2018 0615   PLT 271 12/30/2018 0615   MCV 93.0 12/30/2018 0615   MCH 31.5 12/30/2018 0615   MCHC 33.9 12/30/2018 0615   RDW 13.1 12/30/2018 0615   LYMPHSABS 1.0 12/27/2018 1834   MONOABS 0.2 12/27/2018 1834   EOSABS 0.0 12/27/2018 1834   BASOSABS 0.0 12/27/2018 1834    CMP Latest Ref Rng & Units 12/30/2018 12/27/2018 12/20/2018  Glucose 70 - 99 mg/dL 161(W122(H) 960(A108(H) 540(J114(H)  BUN 6 - 20 mg/dL 9 13 15   Creatinine 0.61 - 1.24 mg/dL 8.111.07 9.14(N1.28(H) 8.291.21  Sodium 135 - 145 mmol/L 136 137 137  Potassium 3.5 - 5.1 mmol/L 3.3(L) 3.5 3.9  Chloride 98 - 111 mmol/L 101 98 101  CO2 22 - 32 mmol/L 25 28 25   Calcium 8.9 - 10.3 mg/dL 8.2(L) 8.4(L) 9.5  Total Protein 6.5 - 8.1 g/dL - 7.8 8.1  Total Bilirubin 0.3 - 1.2 mg/dL - 0.6 0.4  Alkaline Phos 38 - 126 U/L - 57 74  AST 15 - 41 U/L - 32 23  ALT 0 - 44 U/L - 28 35      Microbiology: Recent Results (from the past 240 hour(s))  Blood Culture (routine x 2)     Status: None (Preliminary result)   Collection Time: 12/27/18  6:35 PM  Result Value Ref Range Status   Specimen Description BLOOD RIGHT FA  Final   Special Requests   Final    BOTTLES DRAWN AEROBIC AND ANAEROBIC Blood Culture adequate volume   Culture   Final    NO GROWTH 3 DAYS Performed at [facility], [address] [place], [place] [address]    Report Status PENDING  Incomplete  Blood Culture (routine x 2)     Status: None (Preliminary result)   Collection Time: 12/27/18  6:35 PM  Result Value Ref Range Status   Specimen Description BLOOD RIGHT [facility]  Final   Special Requests   Final    BOTTLES DRAWN AEROBIC AND ANAEROBIC Blood Culture adequate volume    Culture   Final    NO GROWTH 3 DAYS Performed at [facility], [address] [place], [place] [address]    Report Status PENDING  Incomplete  Respiratory Panel by PCR     Status: None   Collection Time: 12/27/18  6:50 PM  Result Value Ref Range Status   Adenovirus NOT DETECTED NOT DETECTED Final   Coronavirus 229E NOT DETECTED NOT DETECTED Final    Comment: (NOTE) The Coronavirus on the Respiratory Panel, DOES NOT test for the novel  Coronavirus (2019 nCoV)    Coronavirus HKU1 NOT DETECTED NOT DETECTED Final   Coronavirus NL63 NOT DETECTED NOT DETECTED Final   Coronavirus OC43 NOT DETECTED NOT DETECTED Final   Metapneumovirus NOT DETECTED NOT DETECTED Final   Rhinovirus / Enterovirus NOT DETECTED NOT DETECTED Final   Influenza A NOT DETECTED NOT DETECTED Final   Influenza B NOT DETECTED NOT DETECTED Final   Parainfluenza Virus 1 NOT DETECTED NOT DETECTED Final   Parainfluenza Virus 2 NOT DETECTED NOT DETECTED Final   Parainfluenza Virus 3 NOT DETECTED NOT DETECTED Final   Parainfluenza Virus 4 NOT DETECTED NOT DETECTED Final   Respiratory Syncytial Virus NOT DETECTED NOT DETECTED Final   Bordetella pertussis NOT DETECTED NOT DETECTED Final   Chlamydophila pneumoniae NOT DETECTED NOT DETECTED Final   Mycoplasma pneumoniae NOT DETECTED NOT DETECTED  Final    Comment: Performed at [facility], [address], [place], [place] [address]  Urine culture     Status: None   Collection Time: 12/27/18  7:25 PM  Result Value Ref Range Status   Specimen Description   Final    URINE, RANDOM Performed at [facility], [address] [place], [place] [address]    Special Requests   Final    NONE Performed at [facility], [address], [place], [place] [address]    Culture   Final    NO GROWTH Performed at [facility], [address], [place], [place] [address]    Report Status 12/29/2018 FINAL  Final  MRSA PCR Screening     Status: None    Collection Time: 12/28/18  1:08 AM  Result Value Ref Range Status   MRSA by PCR NEGATIVE NEGATIVE Final    Comment:        The GeneXpert MRSA Assay (FDA approved for NASAL specimens only), is one component of a comprehensive MRSA colonization surveillance program. It is not intended to diagnose MRSA infection nor to guide or monitor treatment for MRSA infections. Performed at [facility], [address] [place], [place] [address]   C difficile quick scan w PCR reflex     Status: None   Collection Time: 12/29/18 11:43 AM  Result Value Ref Range Status   C Diff antigen NEGATIVE NEGATIVE Final   C Diff toxin NEGATIVE NEGATIVE Final   C Diff interpretation No C. difficile detected.  Final    Comment: Performed at [facility], [address] [place], [place] [address]   procal <0.1  IMAGING RESULTS:  12/28/18   12/30/18   Extensive patchy ground-glass opacity and interlobular septal thickening (crazy paving pattern) and patchy consolidation throughout both lungs involving all lung lobes, predominantly at the periphery of the lungs. There are a spectrum of findings in the lungs which can be seen with acute atypical infection (as well as other non-infectious etiologies). In particular, viral pneumonia (including COVID-19) should be considered in the appropriate clinical setting.  I have personally reviewed the films  EKG QTC 420 ? Impression/Recommendation ? ?48 yr male with no significant PMH presenting with fever and cough and sob b/l Ground glass opacities in CT chest/infiltrates, Normal procal, low WBC, -hypoxia , viral VS atypical pneumonia COVID -19 highly likely eventhough no travel risk or close contact. Will start hydroxychloroquine a sper protocol 400mg  BID x 1 st day and then 200mg  BID x 4 days Will get ferritin, CRP, HIV , LDH, urine legionella antigen On cefepime with no response- DC that. Start Doxy  Await SARS COV 2 result  Explained to patient in great detail- showed him the CT picture also discussed the pros and cons of staring treatment with hydorxychloroquine Keep patient in airborne precaution   ? ___________________________________________________ Discussed with patient, requesting provider

## 2018-12-31 ENCOUNTER — Inpatient Hospital Stay: Payer: 59

## 2018-12-31 ENCOUNTER — Other Ambulatory Visit: Payer: Self-pay

## 2018-12-31 DIAGNOSIS — R0902 Hypoxemia: Secondary | ICD-10-CM

## 2018-12-31 DIAGNOSIS — B9729 Other coronavirus as the cause of diseases classified elsewhere: Secondary | ICD-10-CM

## 2018-12-31 LAB — CBC WITH DIFFERENTIAL/PLATELET
Abs Immature Granulocytes: 0.02 10*3/uL (ref 0.00–0.07)
Basophils Absolute: 0 10*3/uL (ref 0.0–0.1)
Basophils Relative: 0 %
Eosinophils Absolute: 0 10*3/uL (ref 0.0–0.5)
Eosinophils Relative: 1 %
HCT: 35.9 % — ABNORMAL LOW (ref 39.0–52.0)
Hemoglobin: 11.9 g/dL — ABNORMAL LOW (ref 13.0–17.0)
IMMATURE GRANULOCYTES: 1 %
Lymphocytes Relative: 21 %
Lymphs Abs: 0.9 10*3/uL (ref 0.7–4.0)
MCH: 31.2 pg (ref 26.0–34.0)
MCHC: 33.1 g/dL (ref 30.0–36.0)
MCV: 94.2 fL (ref 80.0–100.0)
Monocytes Absolute: 0.4 10*3/uL (ref 0.1–1.0)
Monocytes Relative: 10 %
Neutro Abs: 2.8 10*3/uL (ref 1.7–7.7)
Neutrophils Relative %: 67 %
Platelets: 319 10*3/uL (ref 150–400)
RBC: 3.81 MIL/uL — ABNORMAL LOW (ref 4.22–5.81)
RDW: 13.2 % (ref 11.5–15.5)
WBC: 4.2 10*3/uL (ref 4.0–10.5)
nRBC: 0 % (ref 0.0–0.2)

## 2018-12-31 LAB — URINE DRUG SCREEN, QUALITATIVE (ARMC ONLY)
Amphetamines, Ur Screen: NOT DETECTED
Barbiturates, Ur Screen: NOT DETECTED
Benzodiazepine, Ur Scrn: NOT DETECTED
Cannabinoid 50 Ng, Ur ~~LOC~~: NOT DETECTED
Cocaine Metabolite,Ur ~~LOC~~: NOT DETECTED
MDMA (Ecstasy)Ur Screen: NOT DETECTED
Methadone Scn, Ur: NOT DETECTED
Opiate, Ur Screen: POSITIVE — AB
Phencyclidine (PCP) Ur S: NOT DETECTED
Tricyclic, Ur Screen: NOT DETECTED

## 2018-12-31 LAB — BASIC METABOLIC PANEL
Anion gap: 8 (ref 5–15)
BUN: 8 mg/dL (ref 6–20)
CHLORIDE: 103 mmol/L (ref 98–111)
CO2: 27 mmol/L (ref 22–32)
CREATININE: 1.11 mg/dL (ref 0.61–1.24)
Calcium: 8.3 mg/dL — ABNORMAL LOW (ref 8.9–10.3)
GFR calc Af Amer: >60 mL/min (ref >60)
GFR calc non Af Amer: >60 mL/min (ref >60)
Glucose, Bld: 114 mg/dL — ABNORMAL HIGH (ref 70–99)
Potassium: 3.5 mmol/L (ref 3.5–5.1)
Sodium: 138 mmol/L (ref 135–145)

## 2018-12-31 LAB — MAGNESIUM: Magnesium: 2.3 mg/dL (ref 1.7–2.4)

## 2018-12-31 LAB — C-REACTIVE PROTEIN: CRP: 11.3 mg/dL — ABNORMAL HIGH (ref <1.0)

## 2018-12-31 LAB — LEGIONELLA PNEUMOPHILA SEROGP 1 UR AG: L. PNEUMOPHILA SEROGP 1 UR AG: NEGATIVE

## 2018-12-31 LAB — GLUCOSE, CAPILLARY: Glucose-Capillary: 117 mg/dL — ABNORMAL HIGH (ref 70–99)

## 2018-12-31 LAB — NOVEL CORONAVIRUS, NAA (HOSP ORDER, SEND-OUT TO REF LAB; TAT 18-24 HRS): SARS-CoV-2, NAA: DETECTED — AB

## 2018-12-31 LAB — FERRITIN: Ferritin: 391 ng/mL — ABNORMAL HIGH (ref 24–336)

## 2018-12-31 LAB — PHOSPHORUS: Phosphorus: 2.3 mg/dL — ABNORMAL LOW (ref 2.5–4.6)

## 2018-12-31 MED ORDER — RISAQUAD PO CAPS
1.0000 | ORAL_CAPSULE | Freq: Three times a day (TID) | ORAL | Status: DC
  Administered 2018-12-31 (×2): 1 via ORAL
  Filled 2018-12-31 (×9): qty 1

## 2018-12-31 MED ORDER — ZINC SULFATE 220 (50 ZN) MG PO CAPS
220.0000 mg | ORAL_CAPSULE | ORAL | Status: DC
  Administered 2018-12-31 – 2019-01-02 (×3): 220 mg via ORAL
  Filled 2018-12-31 (×3): qty 1

## 2018-12-31 MED ORDER — ALBUTEROL SULFATE HFA 108 (90 BASE) MCG/ACT IN AERS
2.0000 | INHALATION_SPRAY | RESPIRATORY_TRACT | Status: DC
  Filled 2018-12-31: qty 6.7

## 2018-12-31 MED ORDER — POTASSIUM CHLORIDE CRYS ER 20 MEQ PO TBCR
40.0000 meq | EXTENDED_RELEASE_TABLET | Freq: Once | ORAL | Status: AC
  Administered 2018-12-31: 40 meq via ORAL
  Filled 2018-12-31: qty 2

## 2018-12-31 MED ORDER — ALBUTEROL SULFATE HFA 108 (90 BASE) MCG/ACT IN AERS
2.0000 | INHALATION_SPRAY | RESPIRATORY_TRACT | Status: DC | PRN
  Filled 2018-12-31: qty 6.7

## 2018-12-31 MED ORDER — ALBUTEROL SULFATE (2.5 MG/3ML) 0.083% IN NEBU
2.5000 mg | INHALATION_SOLUTION | RESPIRATORY_TRACT | Status: DC | PRN
  Administered 2018-12-31 – 2019-01-01 (×2): 2.5 mg via RESPIRATORY_TRACT
  Filled 2018-12-31 (×2): qty 3

## 2018-12-31 NOTE — Progress Notes (Addendum)
Temp continues <100 F, Pt A&O x 4, verbalizes understanding of diagnosis of COVID19, airborne and contact precautions maintained,  supp O2 decreased to 2 liters and sats are in mid 90s, will continue to titrate down.  Breath sounds are clear and diminished.  Dry cough continues, non productive

## 2018-12-31 NOTE — Progress Notes (Signed)
CRITICAL CARE NOTE       SUBJECTIVE FINDINGS & SIGNIFICANT EVENTS   Clinically improved, still requiring increased O2 -weaned to 3 L/min nasal cannula now  PAST MEDICAL HISTORY   History reviewed. No pertinent past medical history.   SURGICAL HISTORY   History reviewed. No pertinent surgical history.   FAMILY HISTORY   Family History  Problem Relation Age of Onset  . Diabetes Mother   . Pancreatic cancer Father      SOCIAL HISTORY   Social History   Tobacco Use  . Smoking status: Never Smoker  . Smokeless tobacco: Never Used  Substance Use Topics  . Alcohol use: Yes    Comment: occasionally   . Drug use: Never     MEDICATIONS   Current Medication:  Current Facility-Administered Medications:  .  acetaminophen (TYLENOL) tablet 650 mg, 650 mg, Oral, Q6H PRN, Enid Baas, MD, 650 mg at 12/30/18 0113 .  acidophilus (RISAQUAD) capsule 1 capsule, 1 capsule, Oral, TID, Bertram Savin, RPH, 1 capsule at 12/31/18 0805 .  albuterol (PROVENTIL HFA;VENTOLIN HFA) 108 (90 Base) MCG/ACT inhaler 2 puff, 2 puff, Inhalation, Q2H PRN, Bertram Savin, RPH .  chlorpheniramine-HYDROcodone (TUSSIONEX) 10-8 MG/5ML suspension 5 mL, 5 mL, Oral, Q12H, Bertram Savin, RPH, 5 mL at 12/31/18 0814 .  dextrose 5 %-0.45 % sodium chloride infusion, , Intravenous, Continuous, Salary, Montell D, MD, Last Rate: 100 mL/hr at 12/31/18 [phone] .  diphenoxylate-atropine (LOMOTIL) 2.5-0.025 MG per tablet 1 tablet, 1 tablet, Oral, QID PRN, Salary, Montell D, MD .  doxycycline (VIBRA-TABS) tablet 100 mg, 100 mg, Oral, Q12H, Ravishankar, Jayashree, MD, 100 mg at 12/31/18 0805 .  enoxaparin (LOVENOX) injection 40 mg, 40 mg, Subcutaneous, Q24H, Bertram Savin, RPH, 40 mg at 12/30/18 2114 .  feeding supplement  (ENSURE ENLIVE) (ENSURE ENLIVE) liquid 237 mL, 237 mL, Oral, TID BM, Salary, Montell D, MD, 237 mL at 12/31/18 0808 .  hydroxychloroquine (PLAQUENIL) tablet 200 mg, 200 mg, Oral, Q12H, Ravishankar, Jayashree, MD .  Ipratropium-Albuterol (COMBIVENT) respimat 1 puff, 1 puff, Inhalation, Q6H, Salary, Montell D, MD, 1 puff at 12/31/18 0809 .  MEDLINE mouth rinse, 15 mL, Mouth Rinse, BID, Salary, Montell D, MD, 15 mL at 12/30/18 2116 .  ondansetron (ZOFRAN) tablet 4 mg, 4 mg, Oral, Q6H PRN **OR** ondansetron (ZOFRAN) injection 4 mg, 4 mg, Intravenous, Q6H PRN, Nemiah Commander, Radhika, MD .  sodium chloride flush (NS) 0.9 % injection 3 mL, 3 mL, Intravenous, Q12H, Salary, Montell D, MD, 3 mL at 12/31/18 0808 .  traZODone (DESYREL) tablet 50 mg, 50 mg, Oral, Q24H, Bertram Savin, RPH, 50 mg at 12/30/18 2113 .  zinc sulfate capsule 220 mg, 220 mg, Oral, Q24H, Bertram Savin, RPH, 220 mg at 12/31/18 5631    ALLERGIES   Patient has no known allergies.    REVIEW OF SYSTEMS     Unable to conduct ROS due to coronavirus precautions PHYSICAL EXAMINATION   Vitals:   12/31/18 0700 12/31/18 0800  BP: 134/84   Pulse: 88   Resp: (!) 25   Temp:  99.8 F (37.7 C)  SpO2: 97%     GENERAL: No apparent distress HEAD: Normocephalic, atraumatic.  EYES: Continuously opening eyes, round equal symmetric MOUTH: Moist mucosal membrane. NECK: No appreciable lesions PULMONARY: Nontachypneic CARDIOVASCULAR: Sinus rhythm on telemetry strip GASTROINTESTINAL: Distended MUSCULOSKELETAL: Grossly nonedematous NEUROLOGIC: Mild distress due to acute illness SKIN:intact,  COVID-19 DISASTER DECLARATION:   FULL CONTACT PHYSICAL EXAMINATION WAS NOT POSSIBLE DUE TO  TREATMENT OF COVID-19  AND CONSERVATION OF PERSONAL PROTECTIVE EQUIPMENT, LIMITED EXAM FINDINGS INCLUDE-    Patient assessed or the symptoms described in the history of present illness.  In the context of the Global COVID-19 pandemic, which  necessitated consideration that the patient might be at risk for infection with the SARS-CoV-2 virus that causes COVID-19, Institutional protocols and algorithms that pertain to the evaluation of patients at risk for COVID-19 are in a state of rapid change based on information released by regulatory bodies including the CDC and federal and state organizations. These policies and algorithms were followed during the patient's care while in hospital.    LABS AND IMAGING     -I personally reviewed most recent blood work, imaging and microbiology - significant findings today are both kalemia, mild anemia  LAB RESULTS: Recent Labs  Lab 12/27/18 1834 12/30/18 0615  NA 137 136  K 3.5 3.3*  CL 98 101  CO2 28 25  BUN 13 9  CREATININE 1.28* 1.07  GLUCOSE 108* 122*   Recent Labs  Lab 12/27/18 1834 12/30/18 0615  HGB 14.0 12.6*  HCT 42.3 37.2*  WBC 3.3* 4.5  PLT 228 271     IMAGING RESULTS: Ct Chest W Contrast  Result Date: 12/30/2018 CLINICAL DATA:  Inpatient. Acute hypoxic respiratory failure. One week of fever, cough and dyspnea. High clinical suspicion for COVID-19, with pending COVID-19 test. EXAM: CT CHEST WITH CONTRAST TECHNIQUE: Multidetector CT imaging of the chest was performed during intravenous contrast administration. CONTRAST:  43mL OMNIPAQUE IOHEXOL 300 MG/ML  SOLN COMPARISON:  12/28/2018 chest radiograph. FINDINGS: Cardiovascular: Normal heart size. No significant pericardial effusion/thickening. Three-vessel coronary atherosclerosis. Great vessels are normal in course and caliber. No central pulmonary emboli. Mediastinum/Nodes: No discrete thyroid nodules. Unremarkable esophagus. No pathologically enlarged axillary, mediastinal or hilar lymph nodes. Lungs/Pleura: No pneumothorax. No pleural effusion. There is extensive patchy ground-glass opacity and intralobular septal thickening (crazy paving pattern) and patchy consolidation throughout both lungs involving all lung lobes,  predominantly at the periphery of the lungs. No bronchiectasis. No discrete lung masses or significant pulmonary nodules. Upper abdomen: No acute abnormality. Musculoskeletal: No aggressive appearing focal osseous lesions. Mild thoracic spondylosis. IMPRESSION: 1. Extensive patchy ground-glass opacity and interlobular septal thickening (crazy paving pattern) and patchy consolidation throughout both lungs involving all lung lobes, predominantly at the periphery of the lungs. There are a spectrum of findings in the lungs which can be seen with acute atypical infection (as well as other non-infectious etiologies). In particular, viral pneumonia (including COVID-19) should be considered in the appropriate clinical setting. If there is clinical concern for acute pulmonary infection, consultation with Infectious Disease or Pulmonary Medicine is recommend. 2. Three-vessel coronary atherosclerosis. Electronically Signed   By: Delbert Phenix M.D.   On: 12/30/2018 10:39   Dg Chest Port 1 View  Result Date: 12/31/2018 CLINICAL DATA:  48 year old male with acute hypoxic respiratory failure secondary to possible covert-19 versus vape induced lung injury or atypical pneumonia. EXAM: PORTABLE CHEST 1 VIEW COMPARISON:  Prior chest x-ray 12/28/2018; prior chest CT 12/30/2018 FINDINGS: Slight interval progression of peripherally based wedge-shaped airspace opacities in the mid lung zones bilaterally. Additionally, there is been a slight interval increase in patchy airspace opacity in the bilateral lung bases. Pulmonary volumes are decreased compared to the prior imaging. Overall, comparing across modalities to yesterday's chest CT, the findings appear stable. Cardiac and mediastinal contours remain within normal limits. No pleural effusion or pneumothorax. No acute osseous abnormality. IMPRESSION: Persistent peripheral and  basilar airspace opacities which are grossly stable comparing across modalities to the CT scan performed  yesterday but are advanced compared to the prior chest x-ray from 12/28/2018. Low inspiratory volumes. Electronically Signed   By: Malachy Moan M.D.   On: 12/31/2018 07:58      ASSESSMENT AND PLAN    -Multidisciplinary rounds held today  Acute Hypoxic Respiratory Failure    -due to COVID-19 -continue 3 L/min nasal cannula -continue Bronchodilator Therapy -Microbiology, virology, lab work-up pending   Renal Failure-most likely due to ATN-resolved -follow chem 7 -follow UO   ID -continue IV abx as prescibed -follow up cultures  GI/Nutrition GI PROPHYLAXIS as indicated DIET-->TF's as tolerated Constipation protocol as indicated  ENDO - ICU hypoglycemic\Hyperglycemia protocol -check FSBS per protocol   ELECTROLYTES -follow labs as needed -replace as needed -pharmacy consultation   DVT/GI PRX ordered -SCDs  TRANSFUSIONS AS NEEDED MONITOR FSBS ASSESS the need for LABS as needed   Critical care provider statement:    Critical care time (minutes):  35   Critical care time was exclusive of:  Separately billable procedures and treating other patients   Critical care was necessary to treat or prevent imminent or life-threatening deterioration of the following conditions:   Acute hypoxemic respiratory failure, suspicion for novel coronavirus   Critical care was time spent personally by me on the following activities:  Development of treatment plan with patient or surrogate, discussions with consultants, evaluation of patient's response to treatment, examination of patient, obtaining history from patient or surrogate, ordering and performing treatments and interventions, ordering and review of laboratory studies and re-evaluation of patient's condition.  I assumed direction of critical care for this patient from another provider in my specialty: no    This document was prepared using Dragon voice recognition software and may include unintentional dictation errors.     Vida Rigger, M.D.  Division of Pulmonary & Critical Care Medicine  [facility]

## 2018-12-31 NOTE — Progress Notes (Signed)
Date of Admission:  12/27/2018     ID: Scott Freeman is a 48 y.o. male Active Problems:   Pneumonia COVID-19   Subjective: Feeling a little better No fever But has abdominal discomfort after he takes new meds- Hydoxychloroquine/Doxy  Medications:  . acidophilus  1 capsule Oral TID  . chlorpheniramine-HYDROcodone  5 mL Oral Q12H  . doxycycline  100 mg Oral Q12H  . enoxaparin (LOVENOX) injection  40 mg Subcutaneous Q24H  . feeding supplement (ENSURE ENLIVE)  237 mL Oral TID BM  . hydroxychloroquine  200 mg Oral Q12H  . Ipratropium-Albuterol  1 puff Inhalation Q6H  . mouth rinse  15 mL Mouth Rinse BID  . sodium chloride flush  3 mL Intravenous Q12H  . traZODone  50 mg Oral Q24H  . zinc sulfate  220 mg Oral Q24H    Objective: Vital signs in last 24 hours: Temp:  [99.8 F (37.7 C)-100.1 F (37.8 C)] 99.8 F (37.7 C) (03/31 0800) Pulse Rate:  [71-88] 88 (03/31 0700) Resp:  [15-27] 25 (03/31 0700) BP: (120-140)/(72-89) 134/84 (03/31 0700) SpO2:  [90 %-99 %] 97 % (03/31 0700) Weight:  [112.6 kg] 112.6 kg (03/30 1945)  PHYSICAL EXAM:  General: Alert, cooperative, no distress, appears stated age.  Head: Normocephalic, without obvious abnormality, atraumatic. Abdomen: Soft, non-tender,not distended. Bowel sounds normal. No masses Extremities: atraumatic, no cyanosis. No edema. No clubbing Skin: No rashes or lesions. Or bruising Lymph: Cervical, supraclavicular normal. Neurologic: Grossly non-focal  Lab Results Recent Labs    12/30/18 0615 12/31/18 1100  WBC 4.5 4.2  HGB 12.6* 11.9*  HCT 37.2* 35.9*  NA 136  --   K 3.3*  --   CL 101  --   CO2 25  --   BUN 9  --   CREATININE 1.07  --    Liver Panel No results for input(s): PROT, ALBUMIN, AST, ALT, ALKPHOS, BILITOT, BILIDIR, IBILI in the last 72 hours. Sedimentation Rate No results for input(s): ESRSEDRATE in the last 72 hours. C-Reactive Protein Recent Labs    12/30/18 0617  CRP 12.2*     Microbiology:  Studies/Results: Ct Chest W Contrast  Result Date: 12/30/2018 CLINICAL DATA:  Inpatient. Acute hypoxic respiratory failure. One week of fever, cough and dyspnea. High clinical suspicion for COVID-19, with pending COVID-19 test. EXAM: CT CHEST WITH CONTRAST TECHNIQUE: Multidetector CT imaging of the chest was performed during intravenous contrast administration. CONTRAST:  50mL OMNIPAQUE IOHEXOL 300 MG/ML  SOLN COMPARISON:  12/28/2018 chest radiograph. FINDINGS: Cardiovascular: Normal heart size. No significant pericardial effusion/thickening. Three-vessel coronary atherosclerosis. Great vessels are normal in course and caliber. No central pulmonary emboli. Mediastinum/Nodes: No discrete thyroid nodules. Unremarkable esophagus. No pathologically enlarged axillary, mediastinal or hilar lymph nodes. Lungs/Pleura: No pneumothorax. No pleural effusion. There is extensive patchy ground-glass opacity and intralobular septal thickening (crazy paving pattern) and patchy consolidation throughout both lungs involving all lung lobes, predominantly at the periphery of the lungs. No bronchiectasis. No discrete lung masses or significant pulmonary nodules. Upper abdomen: No acute abnormality. Musculoskeletal: No aggressive appearing focal osseous lesions. Mild thoracic spondylosis. IMPRESSION: 1. Extensive patchy ground-glass opacity and interlobular septal thickening (crazy paving pattern) and patchy consolidation throughout both lungs involving all lung lobes, predominantly at the periphery of the lungs. There are a spectrum of findings in the lungs which can be seen with acute atypical infection (as well as other non-infectious etiologies). In particular, viral pneumonia (including COVID-19) should be considered in the appropriate clinical setting. If there  is clinical concern for acute pulmonary infection, consultation with Infectious Disease or Pulmonary Medicine is recommend. 2. Three-vessel  coronary atherosclerosis. Electronically Signed   By: Delbert Phenix M.D.   On: 12/30/2018 10:39   Dg Chest Port 1 View  Result Date: 12/31/2018 CLINICAL DATA:  48 year old male with acute hypoxic respiratory failure secondary to possible covert-19 versus vape induced lung injury or atypical pneumonia. EXAM: PORTABLE CHEST 1 VIEW COMPARISON:  Prior chest x-ray 12/28/2018; prior chest CT 12/30/2018 FINDINGS: Slight interval progression of peripherally based wedge-shaped airspace opacities in the mid lung zones bilaterally. Additionally, there is been a slight interval increase in patchy airspace opacity in the bilateral lung bases. Pulmonary volumes are decreased compared to the prior imaging. Overall, comparing across modalities to yesterday's chest CT, the findings appear stable. Cardiac and mediastinal contours remain within normal limits. No pleural effusion or pneumothorax. No acute osseous abnormality. IMPRESSION: Persistent peripheral and basilar airspace opacities which are grossly stable comparing across modalities to the CT scan performed yesterday but are advanced compared to the prior chest x-ray from 12/28/2018. Low inspiratory volumes. Electronically Signed   By: Malachy Moan M.D.   On: 12/31/2018 07:58     Assessment/Plan: 48 yr male with no significant PMH presenting with fever and cough and sob b/l Ground glass opacities in CT chest/infiltrates, Normal procal, low WBC, -hypoxia , SARS COV 2  Positive COVID -19  On hydroxychloroquine  400mg  BID x 1 st day and then 200mg  BID x 4 days Ferritin and CRP trending down No fever since yesterday Continue airborne DC doxy Discussed with patient the test result. Discussed with health department

## 2018-12-31 NOTE — Plan of Care (Signed)
Pt with O2 sats 95-97% on 4 liters [place], NP strong cough, Hi Risk COVID precautions continue, up in room and ambulatory w/ no issues, continent B&B, Ax temp improved < 100 F, adequate PO intake

## 2019-01-01 ENCOUNTER — Encounter: Payer: Self-pay | Admitting: Internal Medicine

## 2019-01-01 LAB — BASIC METABOLIC PANEL
Anion gap: 10 (ref 5–15)
BUN: 9 mg/dL (ref 6–20)
CALCIUM: 8.3 mg/dL — AB (ref 8.9–10.3)
CO2: 24 mmol/L (ref 22–32)
CREATININE: 0.96 mg/dL (ref 0.61–1.24)
Chloride: 104 mmol/L (ref 98–111)
GFR calc Af Amer: >60 mL/min (ref >60)
GFR calc non Af Amer: >60 mL/min (ref >60)
Glucose, Bld: 112 mg/dL — ABNORMAL HIGH (ref 70–99)
Potassium: 3.5 mmol/L (ref 3.5–5.1)
Sodium: 138 mmol/L (ref 135–145)

## 2019-01-01 LAB — CBC
HCT: 35.6 % — ABNORMAL LOW (ref 39.0–52.0)
Hemoglobin: 11.7 g/dL — ABNORMAL LOW (ref 13.0–17.0)
MCH: 31 pg (ref 26.0–34.0)
MCHC: 32.9 g/dL (ref 30.0–36.0)
MCV: 94.2 fL (ref 80.0–100.0)
PLATELETS: 342 10*3/uL (ref 150–400)
RBC: 3.78 MIL/uL — ABNORMAL LOW (ref 4.22–5.81)
RDW: 13.1 % (ref 11.5–15.5)
WBC: 4.9 10*3/uL (ref 4.0–10.5)
nRBC: 0 % (ref 0.0–0.2)

## 2019-01-01 LAB — MAGNESIUM: Magnesium: 2.2 mg/dL (ref 1.7–2.4)

## 2019-01-01 LAB — CULTURE, BLOOD (ROUTINE X 2)
Culture: NO GROWTH
Culture: NO GROWTH
Special Requests: ADEQUATE
Special Requests: ADEQUATE

## 2019-01-01 LAB — C-REACTIVE PROTEIN: CRP: 11.9 mg/dL — ABNORMAL HIGH (ref <1.0)

## 2019-01-01 LAB — PHOSPHORUS: Phosphorus: 2.9 mg/dL (ref 2.5–4.6)

## 2019-01-01 LAB — HIV ANTIBODY (ROUTINE TESTING W REFLEX): HIV Screen 4th Generation wRfx: NONREACTIVE

## 2019-01-01 LAB — FERRITIN: Ferritin: 410 ng/mL — ABNORMAL HIGH (ref 24–336)

## 2019-01-01 NOTE — Progress Notes (Signed)
Pt agreeable to transfer to 2A if necessary, he does not wish to transfer to [facility], ok to remain here per Dr [place], fluids DCd per Dr Karna Christmas, PO intake and UOP excellent, pt now on room air and afebrile.   Spoke with Pt's wife Turkey at length.  She reports coughing x 2-3 days, audible coughing and throat clearing while on the phone.  After speaking with Dr Karna Christmas I called Turkey back and advised her to come to the ED here at [facility] and request to be tested d/t her extended exposure to her husband and also her symptoms.  She verbalized understanding

## 2019-01-01 NOTE — Progress Notes (Signed)
CRITICAL CARE NOTE       SUBJECTIVE FINDINGS & SIGNIFICANT EVENTS   Patient transitioned to room air. Will downgrade to medical floor.  Discussed transfer to [place] with Dr [place] - will keep pt here and plan to d/c home.   PAST MEDICAL HISTORY   History reviewed. No pertinent past medical history.   SURGICAL HISTORY   History reviewed. No pertinent surgical history.   FAMILY HISTORY   Family History  Problem Relation Age of Onset  . Diabetes Mother   . Pancreatic cancer Father      SOCIAL HISTORY   Social History   Tobacco Use  . Smoking status: Never Smoker  . Smokeless tobacco: Never Used  Substance Use Topics  . Alcohol use: Yes    Comment: occasionally   . Drug use: Never     MEDICATIONS   Current Medication:  Current Facility-Administered Medications:  .  acetaminophen (TYLENOL) tablet 650 mg, 650 mg, Oral, Q6H PRN, Enid Baas, MD, 650 mg at 12/30/18 0113 .  acidophilus (RISAQUAD) capsule 1 capsule, 1 capsule, Oral, TID, Bertram Savin, RPH, 1 capsule at 12/31/18 1519 .  albuterol (PROVENTIL) (2.5 MG/3ML) 0.083% nebulizer solution 2.5 mg, 2.5 mg, Nebulization, Q2H PRN, Vida Rigger, MD, 2.5 mg at 01/01/19 0758 .  chlorpheniramine-HYDROcodone (TUSSIONEX) 10-8 MG/5ML suspension 5 mL, 5 mL, Oral, Q12H, Bertram Savin, RPH, 5 mL at 01/01/19 0801 .  dextrose 5 %-0.45 % sodium chloride infusion, , Intravenous, Continuous, Salary, Evelena Asa, MD, Stopped at 01/01/19 0755 .  diphenoxylate-atropine (LOMOTIL) 2.5-0.025 MG per tablet 1 tablet, 1 tablet, Oral, QID PRN, Salary, Montell D, MD .  enoxaparin (LOVENOX) injection 40 mg, 40 mg, Subcutaneous, Q24H, Bertram Savin, RPH, 40 mg at 12/31/18 1957 .  feeding supplement (ENSURE ENLIVE) (ENSURE ENLIVE) liquid 237 mL,  237 mL, Oral, TID BM, Salary, Montell D, MD, 237 mL at 12/31/18 1957 .  hydroxychloroquine (PLAQUENIL) tablet 200 mg, 200 mg, Oral, Q12H, Ravishankar, Jayashree, MD, 200 mg at 01/01/19 0801 .  Ipratropium-Albuterol (COMBIVENT) respimat 1 puff, 1 puff, Inhalation, Q6H, Salary, Montell D, MD, 1 puff at 01/01/19 0135 .  MEDLINE mouth rinse, 15 mL, Mouth Rinse, BID, Salary, Montell D, MD, 15 mL at 12/31/18 2004 .  ondansetron (ZOFRAN) tablet 4 mg, 4 mg, Oral, Q6H PRN **OR** ondansetron (ZOFRAN) injection 4 mg, 4 mg, Intravenous, Q6H PRN, Nemiah Commander, Radhika, MD .  sodium chloride flush (NS) 0.9 % injection 3 mL, 3 mL, Intravenous, Q12H, Salary, Montell D, MD, 3 mL at 01/01/19 0801 .  traZODone (DESYREL) tablet 50 mg, 50 mg, Oral, Q24H, Bertram Savin, RPH, 50 mg at 12/31/18 1957 .  zinc sulfate capsule 220 mg, 220 mg, Oral, Q24H, Bertram Savin, RPH, 220 mg at 01/01/19 5697    ALLERGIES   Patient has no known allergies.    REVIEW OF SYSTEMS    [phone] precautions minimized exposure - no new complaints  PHYSICAL EXAMINATION   Vitals:   01/01/19 0900 01/01/19 1000  BP: 136/80 134/87  Pulse: 84 (!) 118  Resp: 20 (!) 21  Temp:    SpO2: (!) 87% 96%    GENERAL:nad HEAD: Normocephalic, atraumatic.  EYES: spontaneously opening, symmetric round.  No scleral icterus.  MOUTH: Moist mucosal membrane. NECK: no neck lesions PULMONARY: non tachypneic CARDIOVASCULAR:  Normal Sinus rythm GASTROINTESTINAL: Soft, nontender, non-distended. No masses. Positive bowel sounds. No hepatosplenomegaly.  MUSCULOSKELETAL: No swelling, clubbing, or edema.  NEUROLOGIC: Mild distress due to acute illness SKIN:intact,warm,dry  LABS AND IMAGING     LAB RESULTS: Recent Labs  Lab 12/30/18 0615 12/31/18 1100 01/01/19 0414  NA 136 138 138  K 3.3* 3.5 3.5  CL 101 103 104  CO2 25 27 24   BUN 9 8 9   CREATININE 1.07 1.11 0.96  GLUCOSE 122* 114* 112*   Recent Labs  Lab 12/30/18 0615  12/31/18 1100 01/01/19 0414  HGB 12.6* 11.9* 11.7*  HCT 37.2* 35.9* 35.6*  WBC 4.5 4.2 4.9  PLT 271 319 342     IMAGING RESULTS: No results found.    ASSESSMENT AND PLAN    This document was prepared using Dragon voice recognition software and may include unintentional dictation errors.  -Multidisciplinary rounds held today  Acute Hypoxic Respiratory Failure    -due to COVID-19 -weaned O2 to room air -continue Bronchodilator Therapy PRN    Renal Failure-most likely due to ATN-resolved -follow chem 7 -follow UO   GI/Nutrition GI PROPHYLAXIS as indicated DIET-->TF's as tolerated Constipation protocol as indicated  ENDO - ICU hypoglycemic\Hyperglycemia protocol -check FSBS per protocol   ELECTROLYTES -follow labs as needed -replace as needed -pharmacy consultation   DVT/GI PRX ordered -SCDs  TRANSFUSIONS AS NEEDED MONITOR FSBS ASSESS the need for LABS as needed     This document was prepared using Dragon voice recognition software and may include unintentional dictation errors.  Vida Rigger, M.D.  Division of Pulmonary & Critical Care Medicine  [facility]

## 2019-01-01 NOTE — Progress Notes (Signed)
[facility] [address] [place], [place] [address]  Internal MEDICINE  Telephone Visit  Patient Name: Scott Freeman  [id number]  [id number]  Date of Service: 01/01/2019  I connected with the patient at 9:32 am  by telephone and verified the patients identity using two identifiers.   I discussed the limitations, risks, security and privacy concerns of performing an evaluation and management service by telephone and the availability of in person appointments. I also discussed with the patient that there may be a patient responsible charge related to the service.  The patient expressed understanding and agrees to proceed.    Chief Complaint  Patient presents with  . Cough    with fever and headaches    HPI     12/23/18 9:32 AM  Note    Spoke with patient concerning cough and fever, He went to ED, was tested for influenza which was negative, Pt was not seen at ED due to long wait time. Went home and took Claritin and Tylenol. He does have cough and minimum sob, He was able to carry out phone conversation without any pause. Does have allergies and pollen was bothering him. He denies any known  exposure to COVID-19.       Current Medication: No facility-administered encounter medications on file as of 12/23/2018.    Outpatient Encounter Medications as of 12/23/2018  Medication Sig  . azithromycin (ZITHROMAX) 250 MG tablet Use as directed  . clotrimazole (LOTRIMIN) 1 % cream Apply to affected area in antecubital fossa BID for 10 days then as needed  . mupirocin ointment (BACTROBAN) 2 % Apply to affected areas on bilateral wrists BID for 10 days then as needed  . predniSONE (STERAPRED UNI-PAK 21 TAB) 10 MG (21) TBPK tablet 6 day taper - take by mouth as directed for 6 days (Patient not taking: Reported on 12/28/2018)  . triamcinolone (KENALOG) 0.025 % cream Apply 1 application topically 2 (two) times daily.    Surgical History: No past surgical history on file.  Medical  History: History reviewed. No pertinent past medical history.  Family History: Family History  Problem Relation Age of Onset  . Diabetes Mother   . Pancreatic cancer Father     Social History   Socioeconomic History  . Marital status: Married    Spouse name: Not on file  . Number of children: Not on file  . Years of education: Not on file  . Highest education level: Not on file  Occupational History  . Not on file  Social Needs  . Financial resource strain: Not on file  . Food insecurity:    Worry: Not on file    Inability: Not on file  . Transportation needs:    Medical: Not on file    Non-medical: Not on file  Tobacco Use  . Smoking status: Never Smoker  . Smokeless tobacco: Never Used  Substance and Sexual Activity  . Alcohol use: Yes    Comment: occasionally   . Drug use: Never  . Sexual activity: Not on file  Lifestyle  . Physical activity:    Days per week: Not on file    Minutes per session: Not on file  . Stress: Not on file  Relationships  . Social connections:    Talks on phone: Not on file    Gets together: Not on file    Attends religious service: Not on file    Active member of club or organization: Not on file  Attends meetings of clubs or organizations: Not on file    Relationship status: Not on file  . Intimate partner violence:    Fear of current or ex partner: Not on file    Emotionally abused: Not on file    Physically abused: Not on file    Forced sexual activity: Not on file  Other Topics Concern  . Not on file  Social History Narrative   Independent at baseline    Review of Systems  Constitutional: Positive for appetite change, chills and fever.  HENT: Negative for voice change.   Respiratory: Positive for cough and chest tightness.        Non productive   Cardiovascular: Positive for chest pain.  Gastrointestinal: Negative for diarrhea.  Musculoskeletal: Positive for arthralgias.  Neurological: Positive for weakness.     Vital Signs: There were no vitals taken for this visit. Observation/Objective: When spoke with pt, did not notice any sob, he was able to complete his sentences. He was ok waiting at home for the time being Spoke with wife as well    Assessment/Plan: 1. Cough Instructed to buy Claritin OTC and flonase    2. Fever and chills Monitor closely for now. Azithromycin was called in. Pt was also instructed to self isolate for now and call back if worsening of symptoms occur   General Counseling: Scott Freeman understanding of the findings of today's phone visit and agrees with plan of treatment. I have discussed any further diagnostic evaluation that may be needed or ordered today. We also reviewed his medications today. he has been encouraged to call the office with any questions or concerns that should arise related to todays visit.    Time spent:22Minutes   Dr Lyndon Code Internal medicine

## 2019-01-02 LAB — BASIC METABOLIC PANEL
Anion gap: 9 (ref 5–15)
BUN: 11 mg/dL (ref 6–20)
CO2: 27 mmol/L (ref 22–32)
Calcium: 8.6 mg/dL — ABNORMAL LOW (ref 8.9–10.3)
Chloride: 104 mmol/L (ref 98–111)
Creatinine, Ser: 1.07 mg/dL (ref 0.61–1.24)
GFR calc Af Amer: >60 mL/min (ref >60)
GFR calc non Af Amer: >60 mL/min (ref >60)
Glucose, Bld: 101 mg/dL — ABNORMAL HIGH (ref 70–99)
Potassium: 3.6 mmol/L (ref 3.5–5.1)
Sodium: 140 mmol/L (ref 135–145)

## 2019-01-02 LAB — CBC WITH DIFFERENTIAL/PLATELET
Abs Immature Granulocytes: 0.04 10*3/uL (ref 0.00–0.07)
Basophils Absolute: 0 10*3/uL (ref 0.0–0.1)
Basophils Relative: 0 %
Eosinophils Absolute: 0.2 10*3/uL (ref 0.0–0.5)
Eosinophils Relative: 4 %
HCT: 36.1 % — ABNORMAL LOW (ref 39.0–52.0)
Hemoglobin: 12 g/dL — ABNORMAL LOW (ref 13.0–17.0)
Immature Granulocytes: 1 %
Lymphocytes Relative: 31 %
Lymphs Abs: 1.4 10*3/uL (ref 0.7–4.0)
MCH: 30.8 pg (ref 26.0–34.0)
MCHC: 33.2 g/dL (ref 30.0–36.0)
MCV: 92.8 fL (ref 80.0–100.0)
Monocytes Absolute: 0.5 10*3/uL (ref 0.1–1.0)
Monocytes Relative: 11 %
Neutro Abs: 2.4 10*3/uL (ref 1.7–7.7)
Neutrophils Relative %: 53 %
Platelets: 423 10*3/uL — ABNORMAL HIGH (ref 150–400)
RBC: 3.89 MIL/uL — ABNORMAL LOW (ref 4.22–5.81)
RDW: 13 % (ref 11.5–15.5)
WBC: 4.6 10*3/uL (ref 4.0–10.5)
nRBC: 0 % (ref 0.0–0.2)

## 2019-01-02 LAB — MAGNESIUM: Magnesium: 2.4 mg/dL (ref 1.7–2.4)

## 2019-01-02 LAB — PHOSPHORUS: Phosphorus: 4.1 mg/dL (ref 2.5–4.6)

## 2019-01-02 LAB — FUNGITELL, SERUM: Fungitell Result: <31 pg/mL (ref <80)

## 2019-01-02 MED ORDER — HYDROXYCHLOROQUINE SULFATE 200 MG PO TABS: 200.0000 mg | ORAL_TABLET | Freq: Two times a day (BID) | ORAL | 0 refills | Status: AC

## 2019-01-02 NOTE — Progress Notes (Signed)
Nutrition Follow-up  RD working remotely.  DOCUMENTATION CODES:   Not applicable  INTERVENTION:  Will discontinue Ensure Enlive as patient reports appetite now back to normal.  Encouraged ongoing adequate intake of calories and protein at meals.  NUTRITION DIAGNOSIS:   Inadequate oral intake related to decreased appetite as evidenced by per patient/family report.  Resolved - appetite and intake back to normal now per patient report.  GOAL:   Patient will meet greater than or equal to 90% of their needs  Progressing (unable to determine calorie/protein intake when working remotely but patient is likely meeting needs).  MONITOR:   PO intake, Supplement acceptance, Labs, Weight trends, I & O's  REASON FOR ASSESSMENT:   Malnutrition Screening Tool    ASSESSMENT:   48 year old male with no significant PMHx admitted with acute hypoxic respiratory failure most likely related to multilobar PNA, undergoing r/o for COVID-19.   -Patient found to be positive for COVID-19.  Spoke with patient over the phone. He reports he is feeling better now and may discharge home today. His appetite has returned to normal. He is now finishing 80-100% of meals, which he reports is his baseline intake. He drank Ensure on days when intake was decreased, but feels he does not need them anymore. RD agrees. Patient with no further nutrition questions/concerns.  Medications reviewed and include: acidophilus.  Labs reviewed.  Diet Order:   Diet Order            Diet regular Room service appropriate? Yes; Fluid consistency: Thin  Diet effective now             EDUCATION NEEDS:   Education needs have been addressed  Skin:  Skin Assessment: Reviewed RN Assessment  Last BM:  12/31/2018 per chart  Height:   Ht Readings from Last 1 Encounters:  12/30/18 6\' 9"  (2.057 m)   Weight:   Wt Readings from Last 1 Encounters:  12/30/18 112.6 kg   Ideal Body Weight:  105.5 kg  BMI:  Body mass  index is 26.6 kg/m.  Estimated Nutritional Needs:   Kcal:  2400-2600  Protein:  120-140 grams  Fluid:  2.4-2.6 L/day  Helane Rima, MS, RD, LDN Office: [phone] Pager: [phone] After Hours/Weekend Pager: [phone]

## 2019-01-02 NOTE — Progress Notes (Signed)
CRITICAL CARE NOTE      SUBJECTIVE FINDINGS & SIGNIFICANT EVENTS   Patient hemodynamically stable , plan for d/c to medical floor or home today   PAST MEDICAL HISTORY   History reviewed. No pertinent past medical history.   SURGICAL HISTORY   History reviewed. No pertinent surgical history.   FAMILY HISTORY   Family History  Problem Relation Age of Onset  . Diabetes Mother   . Pancreatic cancer Father      SOCIAL HISTORY   Social History   Tobacco Use  . Smoking status: Never Smoker  . Smokeless tobacco: Never Used  Substance Use Topics  . Alcohol use: Yes    Comment: occasionally   . Drug use: Never     MEDICATIONS   Current Medication:  Current Facility-Administered Medications:  .  acetaminophen (TYLENOL) tablet 650 mg, 650 mg, Oral, Q6H PRN, Enid Baas, MD, 650 mg at 12/30/18 0113 .  acidophilus (RISAQUAD) capsule 1 capsule, 1 capsule, Oral, TID, Bertram Savin, RPH, 1 capsule at 12/31/18 1519 .  albuterol (PROVENTIL) (2.5 MG/3ML) 0.083% nebulizer solution 2.5 mg, 2.5 mg, Nebulization, Q2H PRN, Vida Rigger, MD, 2.5 mg at 01/01/19 0758 .  chlorpheniramine-HYDROcodone (TUSSIONEX) 10-8 MG/5ML suspension 5 mL, 5 mL, Oral, Q12H, Bertram Savin, RPH, 5 mL at 01/02/19 0846 .  diphenoxylate-atropine (LOMOTIL) 2.5-0.025 MG per tablet 1 tablet, 1 tablet, Oral, QID PRN, Salary, Montell D, MD .  enoxaparin (LOVENOX) injection 40 mg, 40 mg, Subcutaneous, Q24H, Bertram Savin, RPH, 40 mg at 01/01/19 2150 .  hydroxychloroquine (PLAQUENIL) tablet 200 mg, 200 mg, Oral, Q12H, Ravishankar, Jayashree, MD, 200 mg at 01/02/19 0845 .  Ipratropium-Albuterol (COMBIVENT) respimat 1 puff, 1 puff, Inhalation, Q6H, Salary, Montell D, MD, 1 puff at 01/02/19 0847 .  MEDLINE mouth rinse, 15  mL, Mouth Rinse, BID, Salary, Montell D, MD, 15 mL at 12/31/18 2004 .  ondansetron (ZOFRAN) tablet 4 mg, 4 mg, Oral, Q6H PRN **OR** ondansetron (ZOFRAN) injection 4 mg, 4 mg, Intravenous, Q6H PRN, Nemiah Commander, Radhika, MD .  sodium chloride flush (NS) 0.9 % injection 3 mL, 3 mL, Intravenous, Q12H, Salary, Montell D, MD, 3 mL at 01/01/19 2151 .  traZODone (DESYREL) tablet 50 mg, 50 mg, Oral, Q24H, Bertram Savin, RPH, 50 mg at 01/01/19 2143 .  zinc sulfate capsule 220 mg, 220 mg, Oral, Q24H, Bertram Savin, RPH, 220 mg at 01/02/19 3220    ALLERGIES   Patient has no known allergies.    REVIEW OF SYSTEMS     ROS 10 system negative except as per subjective   PHYSICAL EXAMINATION   Vitals:   01/02/19 0700 01/02/19 0800  BP: (!) 134/93 123/77  Pulse: 63 62  Resp:    Temp:  98 F (36.7 C)  SpO2: 92% 95%    GENERAL:NAD HEAD: Normocephalic, atraumatic.  EYES: Pupils equal, round, reactive to light.  No scleral icterus.  MOUTH: Moist mucosal membrane. NECK: Supple. No thyromegaly. No nodules. No JVD.  PULMONARY: CTAB CARDIOVASCULAR: S1 and S2. Regular rate and rhythm. No murmurs, rubs, or gallops.  GASTROINTESTINAL: Soft, nontender, non-distended. No masses. Positive bowel sounds. No hepatosplenomegaly.  MUSCULOSKELETAL: No swelling, clubbing, or edema.  NEUROLOGIC: Mild distress due to acute illness SKIN:intact,warm,dry   LABS AND IMAGING     LAB RESULTS: Recent Labs  Lab 12/31/18 1100 01/01/19 0414 01/02/19 0355  NA 138 138 140  K 3.5 3.5 3.6  CL 103 104 104  CO2 27 24 27   BUN 8  9 11  CREATININE 1.11 0.96 1.07  GLUCOSE 114* 112* 101*   Recent Labs  Lab 12/31/18 1100 01/01/19 0414 01/02/19 0355  HGB 11.9* 11.7* 12.0*  HCT 35.9* 35.6* 36.1*  WBC 4.2 4.9 4.6  PLT 319 342 423*     IMAGING RESULTS: No results found.    ASSESSMENT AND PLAN   Multidisciplinary rounds held today  Acute Hypoxic Respiratory Failure -due to COVID-19 -weaned  O2 to room air -continue Bronchodilator Therapy PRN -plan for d/c    Renal Failure-most likely due to ATN-resolved -follow chem 7 -follow UO   GI/Nutrition GI PROPHYLAXIS as indicated DIET-->TF's as tolerated Constipation protocol as indicated  ENDO - ICU hypoglycemic\Hyperglycemia protocol -check FSBS per protocol   ELECTROLYTES -follow labs as needed -replace as needed -pharmacy consultation   DVT/GI PRX ordered -SCDs  TRANSFUSIONS AS NEEDED MONITOR FSBS ASSESS the need for LABS as needed    This document was prepared using Dragon voice recognition software and may include unintentional dictation errors.    Vida Rigger, M.D.  Division of Pulmonary & Critical Care Medicine  [facility]

## 2019-01-02 NOTE — Discharge Summary (Signed)
DISCHARGE SUMMARY       PAST MEDICAL HISTORY   History reviewed. No pertinent past medical history.   SURGICAL HISTORY   History reviewed. No pertinent surgical history.   FAMILY HISTORY        Family History  Problem Relation Age of Onset  . Diabetes Mother   . Pancreatic cancer Father      SOCIAL HISTORY   Social History        Tobacco Use  . Smoking status: Never Smoker  . Smokeless tobacco: Never Used  Substance Use Topics  . Alcohol use: Yes    Comment: occasionally   . Drug use: Never     MEDICATIONS   Current Medication:  Current Facility-Administered Medications:  .  acetaminophen (TYLENOL) tablet 650 mg, 650 mg, Oral, Q6H PRN, Enid Baas, MD, 650 mg at 12/30/18 0113 .  acidophilus (RISAQUAD) capsule 1 capsule, 1 capsule, Oral, TID, Bertram Savin, RPH, 1 capsule at 12/31/18 1519 .  albuterol (PROVENTIL) (2.5 MG/3ML) 0.083% nebulizer solution 2.5 mg, 2.5 mg, Nebulization, Q2H PRN, Vida Rigger, MD, 2.5 mg at 01/01/19 0758 .  chlorpheniramine-HYDROcodone (TUSSIONEX) 10-8 MG/5ML suspension 5 mL, 5 mL, Oral, Q12H, Bertram Savin, RPH, 5 mL at 01/02/19 0846 .  diphenoxylate-atropine (LOMOTIL) 2.5-0.025 MG per tablet 1 tablet, 1 tablet, Oral, QID PRN, Salary, Montell D, MD .  enoxaparin (LOVENOX) injection 40 mg, 40 mg, Subcutaneous, Q24H, Bertram Savin, RPH, 40 mg at 01/01/19 2150 .  hydroxychloroquine (PLAQUENIL) tablet 200 mg, 200 mg, Oral, Q12H, Ravishankar, Jayashree, MD, 200 mg at 01/02/19 0845 .  Ipratropium-Albuterol (COMBIVENT) respimat 1 puff, 1 puff, Inhalation, Q6H, Salary, Montell D, MD, 1 puff at 01/02/19 0847 .  MEDLINE mouth rinse, 15 mL, Mouth Rinse, BID, Salary, Montell D, MD, 15 mL at 12/31/18 2004 .  ondansetron (ZOFRAN)  tablet 4 mg, 4 mg, Oral, Q6H PRN **OR** ondansetron (ZOFRAN) injection 4 mg, 4 mg, Intravenous, Q6H PRN, Nemiah Commander, Radhika, MD .  sodium chloride flush (NS) 0.9 % injection 3 mL, 3 mL, Intravenous, Q12H, Salary, Montell D, MD, 3 mL at 01/01/19 2151 .  traZODone (DESYREL) tablet 50 mg, 50 mg, Oral, Q24H, Bertram Savin, RPH, 50 mg at 01/01/19 2143 .  zinc sulfate capsule 220 mg, 220 mg, Oral, Q24H, Bertram Savin, RPH, 220 mg at 01/02/19 0174    ALLERGIES   Patient has no known allergies.    REVIEW OF SYSTEMS     ROS 10 system negative except as per subjective   PHYSICAL EXAMINATION       Vitals:   01/02/19 0700 01/02/19 0800  BP: (!) 134/93 123/77  Pulse: 63 62  Resp:    Temp:  98 F (36.7 C)  SpO2: 92% 95%    GENERAL:NAD HEAD: Normocephalic, atraumatic.  EYES: Pupils equal, round, reactive to light.  No scleral icterus.  MOUTH: Moist mucosal membrane. NECK: Supple. No thyromegaly. No nodules. No JVD.  PULMONARY: CTAB CARDIOVASCULAR: S1 and S2. Regular rate and rhythm. No murmurs, rubs, or gallops.  GASTROINTESTINAL: Soft, nontender, non-distended. No masses. Positive bowel sounds. No hepatosplenomegaly.  MUSCULOSKELETAL: No swelling, clubbing, or edema.  NEUROLOGIC: Mild distress due to acute illness SKIN:intact,warm,dry   LABS AND IMAGING     LAB RESULTS: LastLabs       Recent Labs  Lab 12/31/18 1100 01/01/19 0414 01/02/19 0355  NA 138 138 140  K 3.5 3.5 3.6  CL 103 104 104  CO2 27 24 27   BUN 8 9  11  CREATININE 1.11 0.96 1.07  GLUCOSE 114* 112* 101*     LastLabs       Recent Labs  Lab 12/31/18 1100 01/01/19 0414 01/02/19 0355  HGB 11.9* 11.7* 12.0*  HCT 35.9* 35.6* 36.1*  WBC 4.2 4.9 4.6  PLT 319 342 423*       IMAGING RESULTS: No results found.    ASSESSMENT AND PLAN   Multidisciplinary rounds held today  Acute Hypoxic Respiratory Failure -due to COVID-19 -weaned O2 to room air-  PATIENT WAS ASKED TO WALK AROUND ROOM WITH SPO2 MONITOR AND HAD NO OXYGEN DESATURATION DETECTED AND THEREFORE WILL NOT REQUIRE HOME O2 PATIENT ASKED TO COME TO ER IF ANY SYMPTOMS OCCUR -continue Bronchodilator TherapyPRN -plan for d/c    Renal Failure-most likely due to ATN-resolved -follow chem 7 -follow UO   GI/Nutrition GI PROPHYLAXIS as indicated DIET-->TF's as tolerated Constipation protocol as indicated  ENDO - ICU hypoglycemic\Hyperglycemia protocol -check FSBS per protocol   ELECTROLYTES -follow labs as needed -replace as needed -pharmacy consultation   DVT/GI PRX ordered -SCDs  TRANSFUSIONS AS NEEDED MONITOR FSBS ASSESS the need for LABS as needed    This document was prepared using Dragon voice recognition software and may include unintentional dictation errors.    Vida Rigger, M.D.  Division of Pulmonary & Critical Care Medicine  [facility]

## 2019-01-03 ENCOUNTER — Other Ambulatory Visit: Payer: Self-pay

## 2019-01-03 MED ORDER — ALBUTEROL SULFATE (2.5 MG/3ML) 0.083% IN NEBU: 2.5000 mg | INHALATION_SOLUTION | Freq: Four times a day (QID) | RESPIRATORY_TRACT | 3 refills | Status: DC | PRN

## 2019-01-03 MED ORDER — NEBULIZER DEVI: 0 refills | Status: DC

## 2019-01-03 NOTE — Telephone Encounter (Signed)
As per dr Welton Flakes [facility] pres [facility]

## 2019-01-08 ENCOUNTER — Telehealth: Payer: Self-pay | Admitting: Infectious Diseases

## 2019-01-08 NOTE — Telephone Encounter (Signed)
Called patient to find out how he was doing- He was discharged from ICU last week on 01/02/19 after being diagnosed with COVID 19 illness and hypoxia with lung infiltrates- He was treated with 5 days of Hydroxychloroquine- He has been doing well- He has started to exercise and did a 279m jog with no sob- minimal cough is present- overall he is doing well, His wife tested negative

## 2019-05-13 ENCOUNTER — Ambulatory Visit: Payer: 59 | Admitting: Internal Medicine

## 2019-05-20 ENCOUNTER — Ambulatory Visit
Admission: RE | Admit: 2019-05-20 | Discharge: 2019-05-20 | Disposition: A | Discharge status: Home / Self Care | Payer: 59 | Source: Ambulatory Visit | Attending: Internal Medicine | Admitting: Internal Medicine

## 2019-05-20 ENCOUNTER — Ambulatory Visit
Admission: RE | Admit: 2019-05-20 | Discharge: 2019-05-20 | Disposition: A | Discharge status: Home / Self Care | Payer: 59 | Attending: Internal Medicine | Admitting: Internal Medicine

## 2019-05-20 ENCOUNTER — Ambulatory Visit: Payer: 59 | Admitting: Internal Medicine

## 2019-05-20 ENCOUNTER — Other Ambulatory Visit: Payer: Self-pay

## 2019-05-20 ENCOUNTER — Encounter: Payer: Self-pay | Admitting: Internal Medicine

## 2019-05-20 VITALS — BP 150/90 | HR 63 | Resp 16 | Ht >= 80 in | Wt 249.8 lb

## 2019-05-20 DIAGNOSIS — U071 COVID-19: Secondary | ICD-10-CM | POA: Insufficient documentation

## 2019-05-20 DIAGNOSIS — R0600 Dyspnea, unspecified: Secondary | ICD-10-CM | POA: Diagnosis not present

## 2019-05-20 DIAGNOSIS — R030 Elevated blood-pressure reading, without diagnosis of hypertension: Secondary | ICD-10-CM

## 2019-05-20 NOTE — Progress Notes (Signed)
[facility] [address] [place], [place] [address]  Internal MEDICINE  Office Visit Note  Patient Name: Scott GrosserKevin Freeman  [id number]  [id number]  Date of Service: 05/21/2019  Chief Complaint  Patient presents with  . Follow-up    post-covid  . Quality Metric Gaps    colonoscopy    HPI  Pt is here for routine follow up after covid infection and treatment, He was hospitalized for hypoxemia and required O2 and later neb therapy, he is somewhat to his baseline, mild sob is present even now, noticed bp has been elevated as well   Current Medication: Outpatient Encounter Medications as of 05/20/2019  Medication Sig  . albuterol (PROVENTIL) (2.5 MG/3ML) 0.083% nebulizer solution Take 3 mLs (2.5 mg total) by nebulization every 6 (six) hours as needed for wheezing or shortness of breath.  Marland Kitchen. Respiratory Therapy Supplies (NEBULIZER) DEVI Use as directed diag coughing and sob  . [DISCONTINUED] azithromycin (ZITHROMAX) 250 MG tablet Use as directed (Patient not taking: Reported on 05/20/2019)  . [DISCONTINUED] chlorpheniramine-HYDROcodone (TUSSIONEX PENNKINETIC ER) 10-8 MG/5ML SUER Take 5 mLs by mouth every 12 (twelve) hours as needed for cough. (Patient not taking: Reported on 05/20/2019)  . [DISCONTINUED] clotrimazole (LOTRIMIN) 1 % cream Apply to affected area in antecubital fossa BID for 10 days then as needed (Patient not taking: Reported on 05/20/2019)  . [DISCONTINUED] mupirocin ointment (BACTROBAN) 2 % Apply to affected areas on bilateral wrists BID for 10 days then as needed (Patient not taking: Reported on 05/20/2019)  . [DISCONTINUED] predniSONE (STERAPRED UNI-PAK 21 TAB) 10 MG (21) TBPK tablet 6 day taper - take by mouth as directed for 6 days (Patient not taking: Reported on 12/28/2018)  . [DISCONTINUED] triamcinolone (KENALOG) 0.025 % cream Apply 1 application topically 2 (two) times daily. (Patient not taking: Reported on 05/20/2019)   No facility-administered encounter  medications on file as of 05/20/2019.     Surgical History: History reviewed. No pertinent surgical history.  Medical History: History reviewed. No pertinent past medical history.  Family History: Family History  Problem Relation Age of Onset  . Diabetes Mother   . Pancreatic cancer Father     Social History   Socioeconomic History  . Marital status: Married    Spouse name: Not on file  . Number of children: Not on file  . Years of education: Not on file  . Highest education level: Not on file  Occupational History  . Not on file  Social Needs  . Financial resource strain: Not on file  . Food insecurity    Worry: Not on file    Inability: Not on file  . Transportation needs    Medical: Not on file    Non-medical: Not on file  Tobacco Use  . Smoking status: Never Smoker  . Smokeless tobacco: Never Used  Substance and Sexual Activity  . Alcohol use: Yes    Comment: occasionally   . Drug use: Never  . Sexual activity: Not on file  Lifestyle  . Physical activity    Days per week: Not on file    Minutes per session: Not on file  . Stress: Not on file  Relationships  . Social Musicianconnections    Talks on phone: Not on file    Gets together: Not on file    Attends religious service: Not on file    Active member of club or organization: Not on file    Attends meetings of clubs or organizations: Not on file  Relationship status: Not on file  . Intimate partner violence    Fear of current or ex partner: Not on file    Emotionally abused: Not on file    Physically abused: Not on file    Forced sexual activity: Not on file  Other Topics Concern  . Not on file  Social History Narrative   Independent at baseline   Review of Systems  Constitutional: Negative for chills, fatigue and unexpected weight change.  HENT: Negative for congestion, postnasal drip, rhinorrhea, sneezing and sore throat.   Eyes: Negative for redness.  Respiratory: Positive for shortness of  breath. Negative for cough and chest tightness.   Cardiovascular: Negative for chest pain and palpitations.       Elevated BP   Gastrointestinal: Negative for abdominal pain, constipation, diarrhea, nausea and vomiting.  Genitourinary: Negative for dysuria and frequency.  Musculoskeletal: Negative for arthralgias, back pain, joint swelling and neck pain.  Skin: Negative for rash.  Neurological: Negative.  Negative for tremors and numbness.  Hematological: Negative for adenopathy. Does not bruise/bleed easily.  Psychiatric/Behavioral: Negative for behavioral problems (Depression), sleep disturbance and suicidal ideas. The patient is not nervous/anxious.     Vital Signs: BP (!) 150/90   Pulse 63   Resp 16   Ht 6\' 9"  (2.057 m)   Wt 249 lb 12.8 oz (113.3 kg)   SpO2 98%   BMI 26.77 kg/m    Physical Exam Constitutional:      General: He is not in acute distress.    Appearance: He is well-developed. He is not diaphoretic.  HENT:     Head: Normocephalic and atraumatic.     Mouth/Throat:     Pharynx: No oropharyngeal exudate.  Eyes:     Pupils: Pupils are equal, round, and reactive to light.  Neck:     Musculoskeletal: Normal range of motion and neck supple.     Thyroid: No thyromegaly.     Vascular: No JVD.     Trachea: No tracheal deviation.  Cardiovascular:     Rate and Rhythm: Normal rate and regular rhythm.     Heart sounds: Normal heart sounds. No murmur. No friction rub. No gallop.   Pulmonary:     Effort: Pulmonary effort is normal. No respiratory distress.     Breath sounds: No wheezing or rales.  Chest:     Chest wall: No tenderness.  Abdominal:     General: Bowel sounds are normal.     Palpations: Abdomen is soft.  Musculoskeletal: Normal range of motion.  Lymphadenopathy:     Cervical: No cervical adenopathy.  Skin:    General: Skin is warm and dry.  Neurological:     Mental Status: He is alert and oriented to person, place, and time.     Cranial Nerves: No  cranial nerve deficit.  Psychiatric:        Behavior: Behavior normal.        Thought Content: Thought content normal.        Judgment: Judgment normal.    Assessment/Plan: 1. COVID-19 virus detected - Pt is improving however sob present  - SARS-CoV-2 Antibody, IgM - DG Chest 2 View; Future - ECHOCARDIOGRAM COMPLETE; Future  2. Dyspnea, unspecified type - DG Chest 2 View; Future - ECHOCARDIOGRAM COMPLETE; Future  3. Elevated BP without diagnosis of hypertension - Monitor BP at home, will look for LVH on echo and decide about treatment plan   General Counseling: jahiem franzoni understanding of the findings of todays  visit and agrees with plan of treatment. I have discussed any further diagnostic evaluation that may be needed or ordered today. We also reviewed his medications today. he has been encouraged to call the office with any questions or concerns that should arise related to todays visit.  Hypertension Counseling:  The following hypertensive lifestyle modification were recommended and discussed:  1. Limiting alcohol intake to less than 1 oz/day of ethanol:(24 oz of beer or 8 oz of wine or 2 oz of 100-proof whiskey). 2. Take baby ASA 81 mg daily. 3. Importance of regular aerobic exercise and losing weight. 4. Reduce dietary saturated fat and cholesterol intake for overall cardiovascular health. 5. Maintaining adequate dietary potassium, calcium, and magnesium intake. 6. Regular monitoring of the blood pressure. 7. Reduce sodium intake to less than 100 mmol/day (less than 2.3 gm of sodium or less than 6 gm of sodium choride)    Orders Placed This Encounter  Procedures  . DG Chest 2 View  . SARS-CoV-2 Antibody, IgM  . ECHOCARDIOGRAM COMPLETE     Time spent: 25Minutes      Dr Lyndon CodeFozia M Krish Bailly Internal medicine

## 2019-05-26 ENCOUNTER — Telehealth: Payer: Self-pay

## 2019-05-26 NOTE — Telephone Encounter (Signed)
Called pt to inform him that his xray results are normal per Dr. Clayborn Bigness.

## 2019-05-30 ENCOUNTER — Ambulatory Visit: Payer: 59

## 2019-05-30 DIAGNOSIS — U071 COVID-19: Secondary | ICD-10-CM

## 2019-05-30 DIAGNOSIS — R0602 Shortness of breath: Secondary | ICD-10-CM

## 2019-05-30 DIAGNOSIS — R0600 Dyspnea, unspecified: Secondary | ICD-10-CM

## 2019-06-09 IMAGING — CT CT CHEST WITH CONTRAST
2 of 3 series · 15 of 36 positions shown, 18 images · IV contrast (omnipaque)
Comparison: 12/28/2018 chest radiograph.

CLINICAL DATA: Inpatient. Acute hypoxic respiratory failure. One
week of fever, cough and dyspnea. High clinical suspicion for
ZZ6W2-7F, with pending ZZ6W2-7F test.

EXAM:
CT CHEST WITH CONTRAST
TECHNIQUE: Multidetector CT imaging of the chest was performed during
intravenous contrast administration.
CONTRAST:  75mL OMNIPAQUE IOHEXOL 300 MG/ML  SOLN

[Series 2: axial st · axial · 0.78mm/px · z∈[-493,-195]mm · 12 of 175 slices shown, 15 images]
[im 13/175  mediastinal]
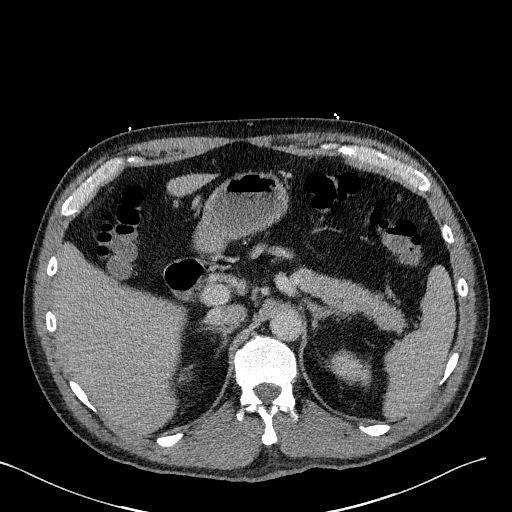
[im 13/175  lung]
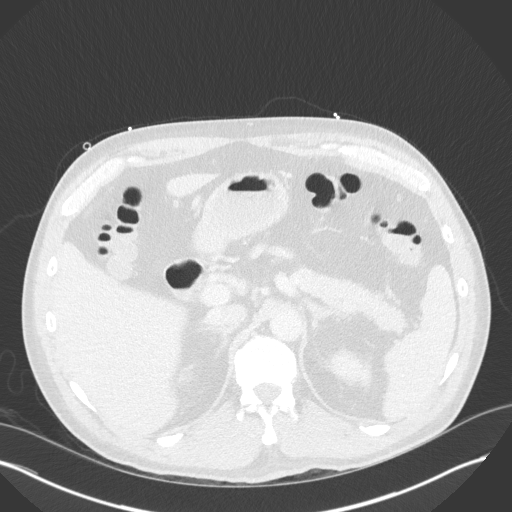
[im 26/175  lung]
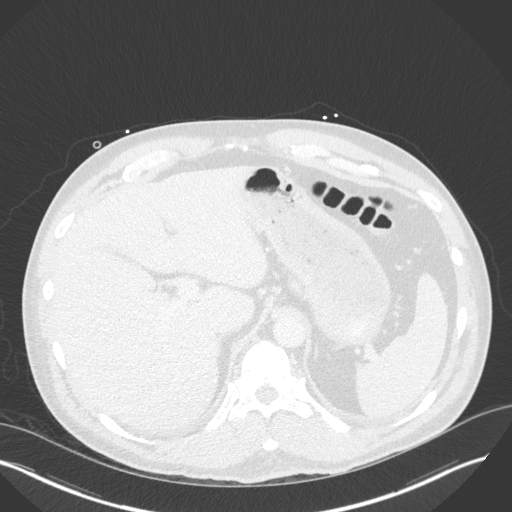
[im 39/175  lung]
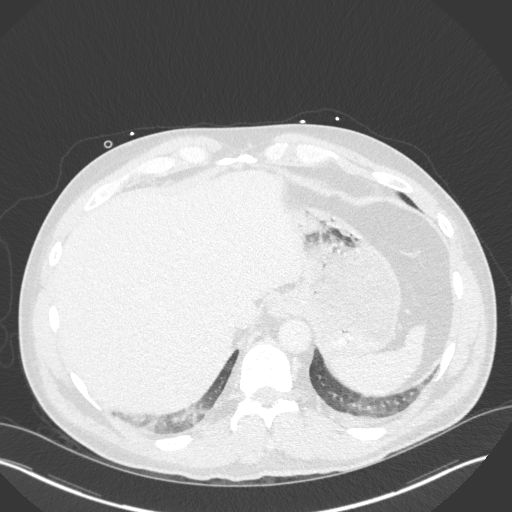
[im 52/175  lung]
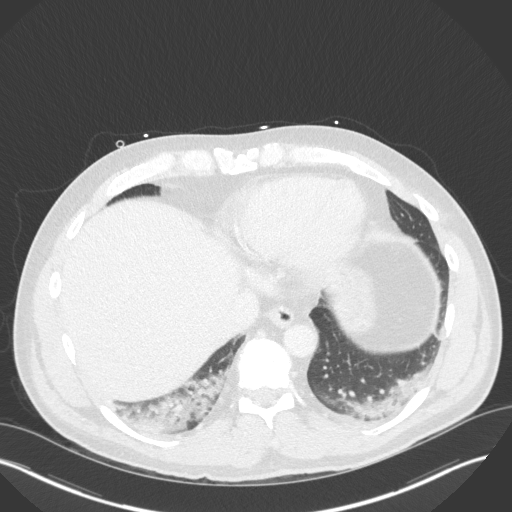
[im 65/175  mediastinal]
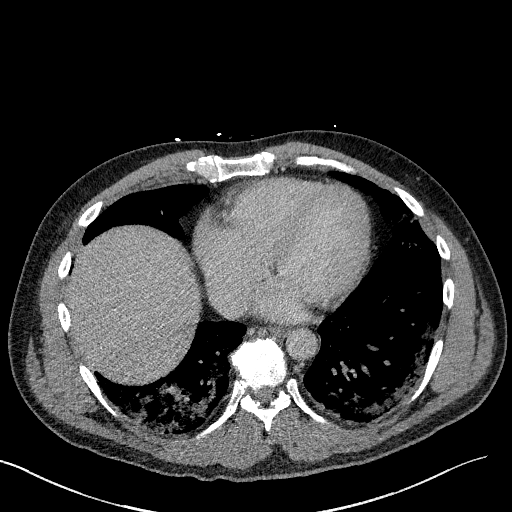
[im 65/175  lung]
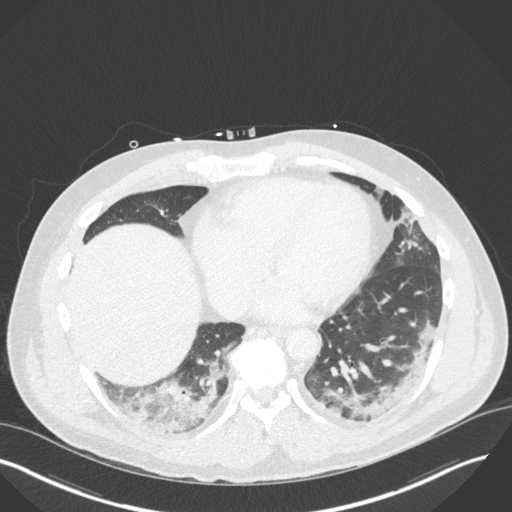
[im 78/175  lung]
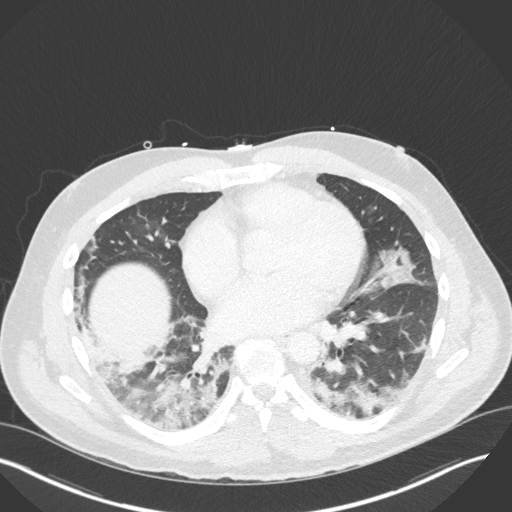
[im 97/175  lung]
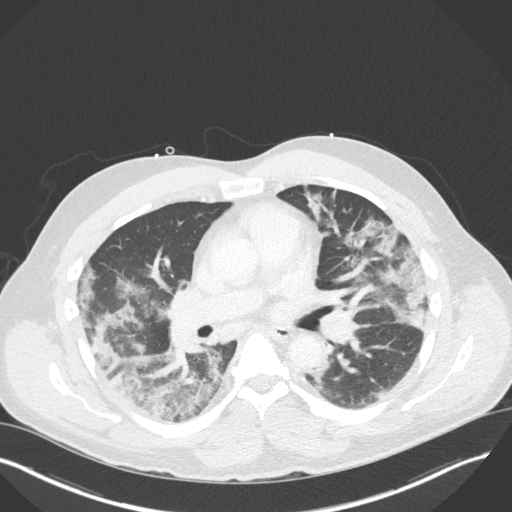
[im 110/175  lung]
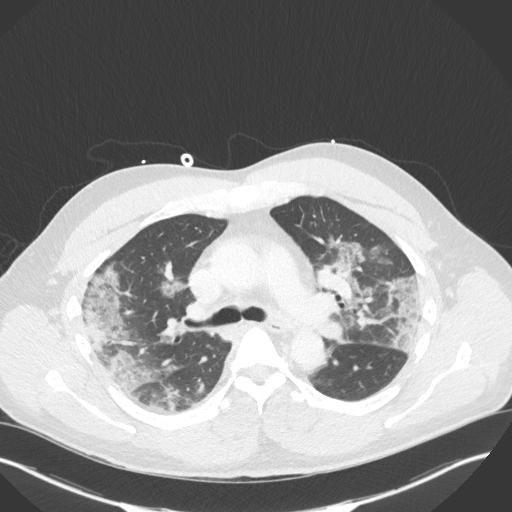
[im 123/175  mediastinal]
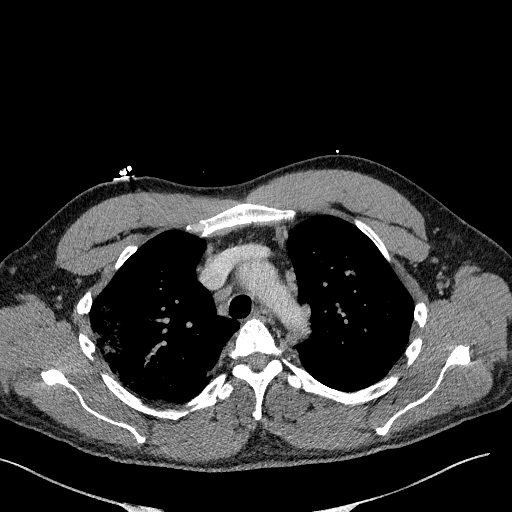
[im 123/175  lung]
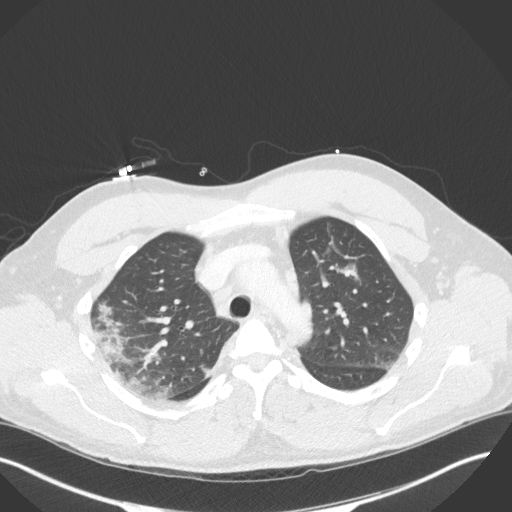
[im 136/175  lung]
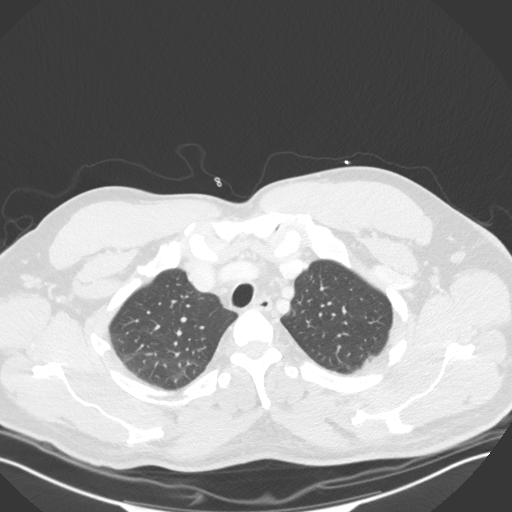
[im 149/175  lung]
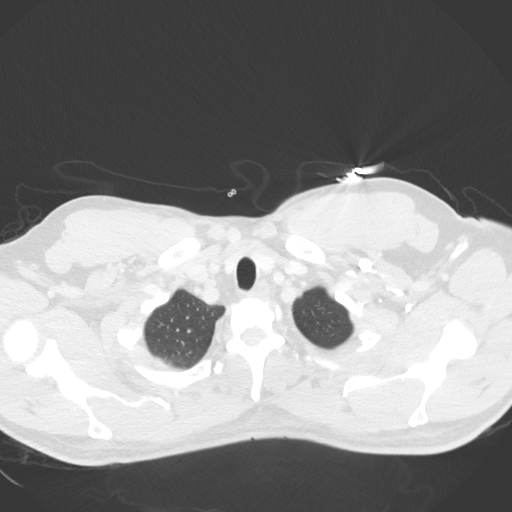
[im 162/175  lung]
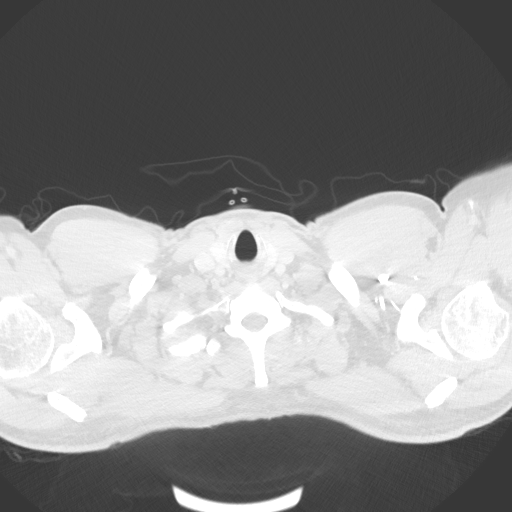

[Series 5: coronal · coronal · 0.70mm/px · 3 of 140 slices shown]
[im 28/140  lung]
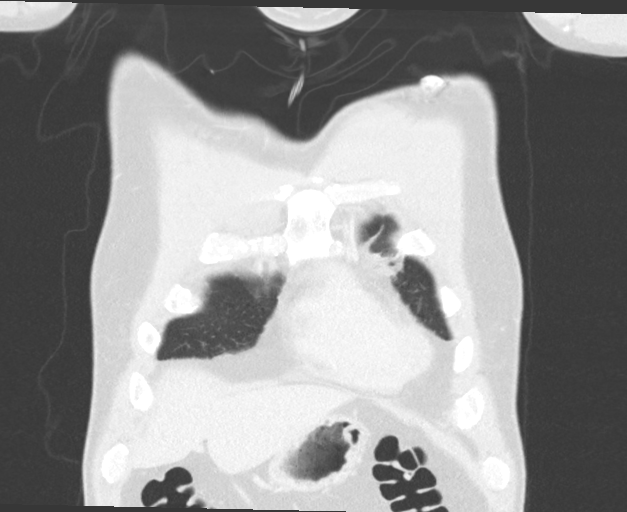
[im 56/140  lung]
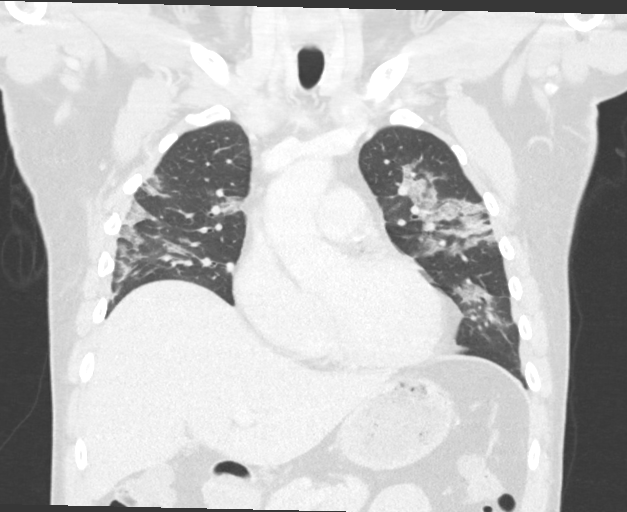
[im 84/140  lung]
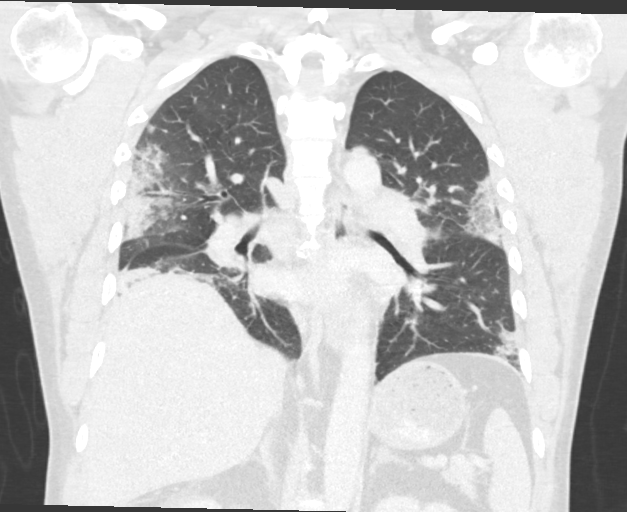

[15 of 36 positions shown; findings below may reference images not displayed]

FINDINGS: Cardiovascular: Normal heart size. No significant pericardial
effusion/thickening. Three-vessel coronary atherosclerosis. Great
vessels are normal in course and caliber. No central pulmonary
emboli.

Mediastinum/Nodes: No discrete thyroid nodules. Unremarkable
esophagus. No pathologically enlarged axillary, mediastinal or hilar
lymph nodes.

Lungs/Pleura: No pneumothorax. No pleural effusion. There is
extensive patchy ground-glass opacity and intralobular septal
thickening (crazy paving pattern) and patchy consolidation
throughout both lungs involving all lung lobes, predominantly at the
periphery of the lungs. No bronchiectasis. No discrete lung masses
or significant pulmonary nodules.

Upper abdomen: No acute abnormality.

Musculoskeletal: No aggressive appearing focal osseous lesions. Mild
thoracic spondylosis.
IMPRESSION: 1. Extensive patchy ground-glass opacity and interlobular septal
thickening (crazy paving pattern) and patchy consolidation
throughout both lungs involving all lung lobes, predominantly at the
periphery of the lungs. There are a spectrum of findings in the
lungs which can be seen with acute atypical infection (as well as
other non-infectious etiologies). In particular, viral pneumonia
(including ZZ6W2-7F) should be considered in the appropriate
clinical setting. If there is clinical concern for acute pulmonary
infection, consultation with Infectious Disease or Pulmonary
Medicine is recommend.
2. Three-vessel coronary atherosclerosis.

## 2019-07-01 ENCOUNTER — Other Ambulatory Visit: Payer: Self-pay

## 2019-07-01 ENCOUNTER — Encounter: Payer: Self-pay | Admitting: Nurse Practitioner

## 2019-07-01 ENCOUNTER — Ambulatory Visit: Payer: 59 | Admitting: Adult Health

## 2019-07-01 VITALS — BP 138/90 | HR 62 | Temp 97.6°F | Resp 16 | Ht >= 80 in | Wt 250.0 lb

## 2019-07-01 DIAGNOSIS — R030 Elevated blood-pressure reading, without diagnosis of hypertension: Secondary | ICD-10-CM | POA: Diagnosis not present

## 2019-07-01 DIAGNOSIS — R0600 Dyspnea, unspecified: Secondary | ICD-10-CM | POA: Diagnosis not present

## 2019-07-01 DIAGNOSIS — U071 COVID-19: Secondary | ICD-10-CM | POA: Diagnosis not present

## 2019-07-01 NOTE — Progress Notes (Addendum)
[facility] [address] [place], [place] [address]  Internal MEDICINE  Office Visit Note  Patient Name: Scott Freeman  [id number]  [id number]  Date of Service: 08/02/2019  Chief Complaint  Patient presents with  . Follow-up    U/S    HPI  PT is seen for follow up on echo results.  His echo shows some diastolic dysfunction, with no other abnormalities.  He reports he has recovered well from the Covid, denies any lingering chest pain or sob.     Current Medication: Outpatient Encounter Medications as of 07/01/2019  Medication Sig  . albuterol (PROVENTIL) (2.5 MG/3ML) 0.083% nebulizer solution Take 3 mLs (2.5 mg total) by nebulization every 6 (six) hours as needed for wheezing or shortness of breath.  Marland Kitchen Respiratory Therapy Supplies (NEBULIZER) DEVI Use as directed diag coughing and sob   No facility-administered encounter medications on file as of 07/01/2019.     Surgical History: History reviewed. No pertinent surgical history.  Medical History: History reviewed. No pertinent past medical history.  Family History: Family History  Problem Relation Age of Onset  . Diabetes Mother   . Pancreatic cancer Father     Social History   Socioeconomic History  . Marital status: Married    Spouse name: Not on file  . Number of children: Not on file  . Years of education: Not on file  . Highest education level: Not on file  Occupational History  . Not on file  Social Needs  . Financial resource strain: Not on file  . Food insecurity    Worry: Not on file    Inability: Not on file  . Transportation needs    Medical: Not on file    Non-medical: Not on file  Tobacco Use  . Smoking status: Never Smoker  . Smokeless tobacco: Never Used  Substance and Sexual Activity  . Alcohol use: Yes    Comment: occasionally   . Drug use: Never  . Sexual activity: Not on file  Lifestyle  . Physical activity    Days per week: Not on file    Minutes per session: Not on  file  . Stress: Not on file  Relationships  . Social Musician on phone: Not on file    Gets together: Not on file    Attends religious service: Not on file    Active member of club or organization: Not on file    Attends meetings of clubs or organizations: Not on file    Relationship status: Not on file  . Intimate partner violence    Fear of current or ex partner: Not on file    Emotionally abused: Not on file    Physically abused: Not on file    Forced sexual activity: Not on file  Other Topics Concern  . Not on file  Social History Narrative   Independent at baseline      Review of Systems  Constitutional: Negative.  Negative for chills, fatigue and unexpected weight change.  HENT: Negative.  Negative for congestion, rhinorrhea, sneezing and sore throat.   Eyes: Negative for redness.  Respiratory: Negative.  Negative for cough, chest tightness and shortness of breath.   Cardiovascular: Negative.  Negative for chest pain and palpitations.  Gastrointestinal: Negative.  Negative for abdominal pain, constipation, diarrhea, nausea and vomiting.  Endocrine: Negative.   Genitourinary: Negative.  Negative for dysuria and frequency.  Musculoskeletal: Negative.  Negative for arthralgias, back pain, joint swelling and neck  pain.  Skin: Negative.  Negative for rash.  Allergic/Immunologic: Negative.   Neurological: Negative.  Negative for tremors and numbness.  Hematological: Negative for adenopathy. Does not bruise/bleed easily.  Psychiatric/Behavioral: Negative.  Negative for behavioral problems, sleep disturbance and suicidal ideas. The patient is not nervous/anxious.     Vital Signs: BP 138/90   Pulse 62   Temp 97.6 F (36.4 C)   Resp 16   Ht 6\' 9"  (2.057 m)   Wt 250 lb (113.4 kg)   SpO2 98%   BMI 26.79 kg/m    Physical Exam Vitals signs and nursing note reviewed.  Constitutional:      General: He is not in acute distress.    Appearance: He is  well-developed. He is not diaphoretic.  HENT:     Head: Normocephalic and atraumatic.     Mouth/Throat:     Pharynx: No oropharyngeal exudate.  Eyes:     Pupils: Pupils are equal, round, and reactive to light.  Neck:     Musculoskeletal: Normal range of motion and neck supple.     Thyroid: No thyromegaly.     Vascular: No JVD.     Trachea: No tracheal deviation.  Cardiovascular:     Rate and Rhythm: Normal rate and regular rhythm.     Heart sounds: Normal heart sounds. No murmur. No friction rub. No gallop.   Pulmonary:     Effort: Pulmonary effort is normal. No respiratory distress.     Breath sounds: Normal breath sounds. No wheezing or rales.  Chest:     Chest wall: No tenderness.  Abdominal:     Palpations: Abdomen is soft.     Tenderness: There is no abdominal tenderness. There is no guarding.  Musculoskeletal: Normal range of motion.  Lymphadenopathy:     Cervical: No cervical adenopathy.  Skin:    General: Skin is warm and dry.  Neurological:     Mental Status: He is alert and oriented to person, place, and time.     Cranial Nerves: No cranial nerve deficit.  Psychiatric:        Behavior: Behavior normal.        Thought Content: Thought content normal.        Judgment: Judgment normal.     Assessment/Plan: 1. COVID-19 virus detected In recovery at this time.  Continue to follow  We discussed his diastolic dysfunction, and he declined a sleep study to rule out osa at this time.   2. Dyspnea, unspecified type Appears improved at this time.  Continue to follow.  3. Elevated BP without diagnosis of hypertension Initially 162/110, after a few minutes, was rechecked and found to be 138/90.     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 I have discussed any further diagnostic evaluation that may be needed or ordered today. We also reviewed his medications today. he has been encouraged to call  the office with any questions or concerns that should arise related to todays visit. Pt needs to monitor his BP at home, Might need a 24 hour blood pressure ambulatory monitor .   Time spent: 25 Minutes   This patient was seen by Orson Gear AGNP-C in Collaboration with Dr Lavera Guise as a part of collaborative care agreement     Kendell Bane AGNP-C Internal medicine

## 2019-08-02 NOTE — Addendum Note (Signed)
Addended by: Lavera Guise on: 08/02/2019 01:07 PM   Modules accepted: Level of Service

## 2019-08-21 ENCOUNTER — Telehealth: Payer: Self-pay

## 2019-08-21 NOTE — Telephone Encounter (Signed)
LMOM FOR PATIENT TO CONFIRM AND GO OVER SCREENING QUESTIONS FOR 08-25-19 OV.

## 2019-08-25 ENCOUNTER — Encounter: Payer: 59 | Admitting: Nurse Practitioner

## 2019-08-26 ENCOUNTER — Telehealth: Payer: Self-pay

## 2019-08-26 NOTE — Telephone Encounter (Signed)
BILLED MISSED APPT FEE 08/25/19. BETH °

## 2019-09-05 ENCOUNTER — Telehealth: Payer: Self-pay

## 2019-09-05 NOTE — Telephone Encounter (Signed)
LMOM FOR PATIENT TO CONFIRM 09-09-19 AND SCREEN FOR COVID.

## 2019-09-08 ENCOUNTER — Telehealth: Payer: Self-pay

## 2019-09-08 NOTE — Telephone Encounter (Signed)
PT REQUESTING TO CX APPT NOT FEELING WELL OFFRED VIRTUAL BUT WANTED TO CX.

## 2019-09-09 ENCOUNTER — Ambulatory Visit: Payer: 59 | Admitting: Internal Medicine

## 2020-10-07 ENCOUNTER — Encounter: Payer: Self-pay | Admitting: Hospice and Palliative Medicine

## 2020-10-07 ENCOUNTER — Ambulatory Visit: Payer: 59 | Admitting: Hospice and Palliative Medicine

## 2020-10-07 VITALS — Temp 98.7°F

## 2020-10-07 DIAGNOSIS — Z20822 Contact with and (suspected) exposure to covid-19: Secondary | ICD-10-CM | POA: Diagnosis not present

## 2020-10-07 DIAGNOSIS — Z8616 Personal history of COVID-19: Secondary | ICD-10-CM

## 2020-10-07 MED ORDER — AZITHROMYCIN 250 MG PO TABS: ORAL_TABLET | ORAL | 0 refills | Status: DC

## 2020-10-07 NOTE — Progress Notes (Unsigned)
[facility] [address] [place], [place] [address]  Internal MEDICINE  Telephone Visit  Patient Name: Scott Freeman  [id number]  [id number]  Date of Service: 10/08/2020  I connected with the patient at 1213 by telephone and verified the patients identity using two identifiers.   I discussed the limitations, risks, security and privacy concerns of performing an evaluation and management service by telephone and the availability of in person appointments. I also discussed with the patient that there may be a patient responsible charge related to the service.  The patient expressed understanding and agrees to proceed.    Chief Complaint  Patient presents with  . Telephone Assessment  . Telephone Screen  . Sore Throat  . Cough  . Headache    HPI Patient is being seen via virtual visit for acute sick visit Wife has recently tested positive for COVID on 12/31 He has started to have symptoms including chest congestion, sore through, cough and headache  He was diagnosed with COVID 06/2019-was hospitalized for one week due to hypoxia and secondary pneumonia Has not been vaccinated for COVID  Has not yet been tested, but direct contact with his wife Would like to avoid hospitalization--feels his symptoms at this time are much milder compared to his first COVID diagnosis  Current Medication: Outpatient Encounter Medications as of 10/07/2020  Medication Sig  . azithromycin (ZITHROMAX Z-PAK) 250 MG tablet Take two 250 mg tablets on day one followed by one 250 mg tablet each day for four days.  Marland Kitchen albuterol (PROVENTIL) (2.5 MG/3ML) 0.083% nebulizer solution Take 3 mLs (2.5 mg total) by nebulization every 6 (six) hours as needed for wheezing or shortness of breath. (Patient not taking: Reported on 10/07/2020)  . Respiratory Therapy Supplies (NEBULIZER) DEVI Use as directed diag coughing and sob (Patient not taking: Reported on 10/07/2020)   No facility-administered encounter medications  on file as of 10/07/2020.    Surgical History: History reviewed. No pertinent surgical history.  Medical History: History reviewed. No pertinent past medical history.  Family History: Family History  Problem Relation Age of Onset  . Diabetes Mother   . Pancreatic cancer Father     Social History   Socioeconomic History  . Marital status: Married    Spouse name: Not on file  . Number of children: Not on file  . Years of education: Not on file  . Highest education level: Not on file  Occupational History  . Not on file  Tobacco Use  . Smoking status: Never Smoker  . Smokeless tobacco: Never Used  Vaping Use  . Vaping Use: Never used  Substance and Sexual Activity  . Alcohol use: Yes    Comment: occasionally   . Drug use: Never  . Sexual activity: Not on file  Other Topics Concern  . Not on file  Social History Narrative   Independent at baseline   Social Determinants of Health   Financial Resource Strain: Not on file  Food Insecurity: Not on file  Transportation Needs: Not on file  Physical Activity: Not on file  Stress: Not on file  Social Connections: Not on file  Intimate Partner Violence: Not on file      Review of Systems  Constitutional: Negative for chills, fatigue and unexpected weight change.  HENT: Positive for congestion and sore throat. Negative for postnasal drip, rhinorrhea and sneezing.   Eyes: Negative for redness.  Respiratory: Positive for cough. Negative for chest tightness and shortness of breath.   Cardiovascular: Negative  for chest pain and palpitations.  Gastrointestinal: Negative for abdominal pain, constipation, diarrhea, nausea and vomiting.  Genitourinary: Negative for dysuria and frequency.  Musculoskeletal: Negative for arthralgias, back pain, joint swelling and neck pain.  Skin: Negative for rash.  Neurological: Negative for tremors and numbness.  Hematological: Negative for adenopathy. Does not bruise/bleed easily.   Psychiatric/Behavioral: Negative for behavioral problems (Depression), sleep disturbance and suicidal ideas. The patient is not nervous/anxious.     Vital Signs: Temp 98.7 F (37.1 C)    Observation/Objective: No acute distress noted  Assessment/Plan: 1. Personal history of COVID-19 S/p COVID pneumonia, high risk of reinfection  2. Close exposure to COVID-19 virus Wife tested positive Advised him to go for testing Discussed CXR--he does not feel as though this is necessary at this time Treat with ZPAK, advised to contact office if symptoms worsen or have not improved in 1 week - azithromycin (ZITHROMAX Z-PAK) 250 MG tablet; Take two 250 mg tablets on day one followed by one 250 mg tablet each day for four days.  Dispense: 6 each; Refill: 0  General Counseling: Scott Freeman understanding of the findings of today's phone visit and agrees with plan of treatment. I have discussed any further diagnostic evaluation that may be needed or ordered today. We also reviewed his medications today. he has been encouraged to call the office with any questions or concerns that should arise related to todays visit.   Meds ordered this encounter  Medications  . azithromycin (ZITHROMAX Z-PAK) 250 MG tablet    Sig: Take two 250 mg tablets on day one followed by one 250 mg tablet each day for four days.    Dispense:  6 each    Refill:  0     Time spent: 30 Minutes Time spent includes review of chart, medications, test results and follow-up plan with the patient.  Lubertha Basque Tayven Renteria AGNP-C Internal medicine

## 2020-10-08 ENCOUNTER — Encounter: Payer: Self-pay | Admitting: Hospice and Palliative Medicine

## 2021-01-04 ENCOUNTER — Other Ambulatory Visit: Payer: Self-pay

## 2021-01-04 ENCOUNTER — Ambulatory Visit: Payer: 59 | Admitting: Hospice and Palliative Medicine

## 2021-01-04 ENCOUNTER — Encounter: Payer: Self-pay | Admitting: Hospice and Palliative Medicine

## 2021-01-04 VITALS — BP 138/88 | HR 63 | Temp 97.4°F | Resp 16 | Ht >= 80 in | Wt 242.8 lb

## 2021-01-04 DIAGNOSIS — R5383 Other fatigue: Secondary | ICD-10-CM

## 2021-01-04 DIAGNOSIS — B370 Candidal stomatitis: Secondary | ICD-10-CM | POA: Diagnosis not present

## 2021-01-04 MED ORDER — NYSTATIN 100000 UNIT/ML MT SUSP: 5.0000 mL | Freq: Four times a day (QID) | OROMUCOSAL | 0 refills | Status: DC

## 2021-01-04 NOTE — Progress Notes (Signed)
[facility] [address] [place], [place] [address]  Internal MEDICINE  Office Visit Note  Patient Name: Scott Freeman  [id number]  [id number]  Date of Service: 01/06/2021  Chief Complaint  Patient presents with  . Acute Visit    Thrush for 3 weeks to a month   . Quality Metric Gaps    Colonoscopy      HPI Pt is here for a sick visit. C/o oral thrush, thickness and discoloration to his tongue Started after receiving antibiotics during his second round of having COVID but feels as though over the last several weeks has gotten worse Has been using OTC mouth washes   Denies sores or lesions or sore throat  Current Medication:  Outpatient Encounter Medications as of 01/04/2021  Medication Sig  . nystatin (MYCOSTATIN) 100000 UNIT/ML suspension Take 5 mLs (500,000 Units total) by mouth 4 (four) times daily.  . [DISCONTINUED] albuterol (PROVENTIL) (2.5 MG/3ML) 0.083% nebulizer solution Take 3 mLs (2.5 mg total) by nebulization every 6 (six) hours as needed for wheezing or shortness of breath. (Patient not taking: Reported on 10/07/2020)  . [DISCONTINUED] azithromycin (ZITHROMAX Z-PAK) 250 MG tablet Take two 250 mg tablets on day one followed by one 250 mg tablet each day for four days.  . [DISCONTINUED] Respiratory Therapy Supplies (NEBULIZER) DEVI Use as directed diag coughing and sob (Patient not taking: No sig reported)   No facility-administered encounter medications on file as of 01/04/2021.      Medical History: History reviewed. No pertinent past medical history.   Vital Signs: BP 138/88   Pulse 63   Temp (!) 97.4 F (36.3 C)   Resp 16   Ht 6\' 9"  (2.057 m)   Wt 242 lb 12.8 oz (110.1 kg)   SpO2 99%   BMI 26.02 kg/m    Review of Systems  Constitutional: Negative for chills, fatigue and unexpected weight change.  HENT: Negative for congestion, postnasal drip, rhinorrhea, sneezing and sore throat.        Tenderness to tongue, thick coating on tongue   Eyes: Negative for redness.  Respiratory: Negative for cough, chest tightness and shortness of breath.   Cardiovascular: Negative for chest pain and palpitations.  Gastrointestinal: Negative for abdominal pain, constipation, diarrhea, nausea and vomiting.  Genitourinary: Negative for dysuria and frequency.  Musculoskeletal: Negative for arthralgias, back pain, joint swelling and neck pain.  Skin: Negative for rash.  Neurological: Negative for tremors and numbness.  Hematological: Negative for adenopathy. Does not bruise/bleed easily.  Psychiatric/Behavioral: Negative for behavioral problems (Depression), sleep disturbance and suicidal ideas. The patient is not nervous/anxious.     Physical Exam Vitals reviewed.  Constitutional:      Appearance: Normal appearance. He is normal weight.  HENT:     Mouth/Throat:     Mouth: Mucous membranes are moist.     Pharynx: Oropharynx is clear. Uvula midline.     Comments: Erythema and roughness to tongue, no apparent open sores Cardiovascular:     Rate and Rhythm: Normal rate and regular rhythm.     Pulses: Normal pulses.     Heart sounds: Normal heart sounds.  Pulmonary:     Effort: Pulmonary effort is normal.     Breath sounds: Normal breath sounds.  Abdominal:     General: Abdomen is flat.     Palpations: Abdomen is soft.  Skin:    General: Skin is warm.  Neurological:     General: No focal deficit present.  Mental Status: He is alert and oriented to person, place, and time. Mental status is at baseline.  Psychiatric:        Mood and Affect: Mood normal.        Behavior: Behavior normal.        Thought Content: Thought content normal.        Judgment: Judgment normal.   Assessment/Plan: 1. Thrush Start Nystatin rinse up to 4 times daily Advised to contact office if symptoms have not improved within 1 week - nystatin (MYCOSTATIN) 100000 UNIT/ML suspension; Take 5 mLs (500,000 Units total) by mouth 4 (four) times daily.   Dispense: 60 mL; Refill: 0  2. Other fatigue - CBC w/Diff/Platelet - Comprehensive Metabolic Panel (CMET) - Lipid Panel With LDL/HDL Ratio - TSH + free T4 - PSA, total and free  General Counseling: [name] verbalizes understanding of the findings of todays visit and agrees with plan of treatment. I have discussed any further diagnostic evaluation that may be needed or ordered today. We also reviewed his medications today. he has been encouraged to call the office with any questions or concerns that should arise related to todays visit.   Orders Placed This Encounter  Procedures  . CBC w/Diff/Platelet  . Comprehensive Metabolic Panel (CMET)  . Lipid Panel With LDL/HDL Ratio  . TSH + free T4  . PSA, total and free    Meds ordered this encounter  Medications  . nystatin (MYCOSTATIN) 100000 UNIT/ML suspension    Sig: Take 5 mLs (500,000 Units total) by mouth 4 (four) times daily.    Dispense:  60 mL    Refill:  0    Time spent: 25 Minutes  This patient was seen by Leeanne Deed AGNP-C in Collaboration with Dr Lyndon Code as a part of collaborative care agreement.  Lubertha Basque [facility] Internal Medicine

## 2021-01-06 ENCOUNTER — Encounter: Payer: Self-pay | Admitting: Hospice and Palliative Medicine

## 2021-01-10 ENCOUNTER — Encounter: Payer: Self-pay | Admitting: Hospice and Palliative Medicine

## 2021-01-10 ENCOUNTER — Other Ambulatory Visit: Payer: Self-pay

## 2021-01-10 MED ORDER — FLUCONAZOLE 100 MG PO TABS: ORAL_TABLET | ORAL | 0 refills | Status: DC

## 2021-01-10 NOTE — Telephone Encounter (Signed)
Spoke to pt and informed him of medication sent to pharmacy and directions.  Informed pt that if he is no better by day 4 that he needs to call the office

## 2021-01-11 ENCOUNTER — Telehealth: Payer: Self-pay

## 2021-01-11 NOTE — Telephone Encounter (Signed)
Faxed cologuard 

## 2021-01-21 ENCOUNTER — Other Ambulatory Visit: Payer: Self-pay

## 2021-01-21 ENCOUNTER — Other Ambulatory Visit: Payer: Self-pay | Admitting: Hospice and Palliative Medicine

## 2021-01-21 MED ORDER — FLUCONAZOLE 150 MG PO TABS: ORAL_TABLET | ORAL | 0 refills | Status: DC

## 2021-01-25 LAB — CBC WITH DIFFERENTIAL/PLATELET
Basophils Absolute: 0.1 10*3/uL (ref 0.0–0.2)
Basos: 2 %
EOS (ABSOLUTE): 0.2 10*3/uL (ref 0.0–0.4)
Eos: 4 %
Hematocrit: 40.8 % (ref 37.5–51.0)
Hemoglobin: 13.8 g/dL (ref 13.0–17.7)
Immature Grans (Abs): 0 10*3/uL (ref 0.0–0.1)
Immature Granulocytes: 0 %
Lymphocytes Absolute: 2.3 10*3/uL (ref 0.7–3.1)
Lymphs: 49 %
MCH: 30.8 pg (ref 26.6–33.0)
MCHC: 33.8 g/dL (ref 31.5–35.7)
MCV: 91 fL (ref 79–97)
Monocytes Absolute: 0.4 10*3/uL (ref 0.1–0.9)
Monocytes: 8 %
Neutrophils Absolute: 1.7 10*3/uL (ref 1.4–7.0)
Neutrophils: 37 %
Platelets: 266 10*3/uL (ref 150–450)
RBC: 4.48 x10E6/uL (ref 4.14–5.80)
RDW: 13.5 % (ref 11.6–15.4)
WBC: 4.6 10*3/uL (ref 3.4–10.8)

## 2021-01-25 LAB — LIPID PANEL WITH LDL/HDL RATIO
Cholesterol, Total: 206 mg/dL — ABNORMAL HIGH (ref 100–199)
HDL: 49 mg/dL (ref >39)
LDL Chol Calc (NIH): 147 mg/dL — ABNORMAL HIGH (ref 0–99)
LDL/HDL Ratio: 3 ratio (ref 0.0–3.6)
Triglycerides: 57 mg/dL (ref 0–149)
VLDL Cholesterol Cal: 10 mg/dL (ref 5–40)

## 2021-01-25 LAB — TSH+FREE T4
Free T4: 1.26 ng/dL (ref 0.82–1.77)
TSH: 2.47 u[IU]/mL (ref 0.450–4.500)

## 2021-01-25 LAB — COMPREHENSIVE METABOLIC PANEL
ALT: 26 IU/L (ref 0–44)
AST: 15 IU/L (ref 0–40)
Albumin/Globulin Ratio: 1.6 (ref 1.2–2.2)
Albumin: 4.6 g/dL (ref 3.8–4.9)
Alkaline Phosphatase: 79 IU/L (ref 44–121)
BUN/Creatinine Ratio: 13 (ref 9–20)
BUN: 15 mg/dL (ref 6–24)
Bilirubin Total: 0.5 mg/dL (ref 0.0–1.2)
CO2: 23 mmol/L (ref 20–29)
Calcium: 9.8 mg/dL (ref 8.7–10.2)
Chloride: 103 mmol/L (ref 96–106)
Creatinine, Ser: 1.16 mg/dL (ref 0.76–1.27)
Globulin, Total: 2.8 g/dL (ref 1.5–4.5)
Glucose: 97 mg/dL (ref 65–99)
Potassium: 4.4 mmol/L (ref 3.5–5.2)
Sodium: 140 mmol/L (ref 134–144)
Total Protein: 7.4 g/dL (ref 6.0–8.5)
eGFR: 76 mL/min/{1.73_m2} (ref >59)

## 2021-01-25 LAB — PSA, TOTAL AND FREE
PSA, Free Pct: 31.7 %
PSA, Free: 0.19 ng/mL
Prostate Specific Ag, Serum: 0.6 ng/mL (ref 0.0–4.0)

## 2021-01-25 NOTE — Progress Notes (Signed)
Labs reviewed, will be discussed at upcoming visit.

## 2021-02-01 ENCOUNTER — Other Ambulatory Visit: Payer: Self-pay

## 2021-02-01 ENCOUNTER — Ambulatory Visit: Payer: 59 | Admitting: Hospice and Palliative Medicine

## 2021-02-01 ENCOUNTER — Encounter: Payer: Self-pay | Admitting: Hospice and Palliative Medicine

## 2021-02-01 ENCOUNTER — Telehealth: Payer: Self-pay

## 2021-02-01 VITALS — BP 122/74 | HR 63 | Temp 97.5°F | Resp 16 | Ht >= 80 in | Wt 238.2 lb

## 2021-02-01 DIAGNOSIS — B370 Candidal stomatitis: Secondary | ICD-10-CM

## 2021-02-01 DIAGNOSIS — J039 Acute tonsillitis, unspecified: Secondary | ICD-10-CM | POA: Diagnosis not present

## 2021-02-01 NOTE — Progress Notes (Signed)
[facility] [address] [place], [place] [address]  Internal MEDICINE  Office Visit Note  Patient Name: Scott Freeman  [id number]  [id number]  Date of Service: 02/01/2021  Chief Complaint  Patient presents with  . Acute Visit    Ginette Pitman has gotten worse, at the back of throat      HPI Pt is here for a sick visit. Has been complaining of thrush like symptoms for several weeks Symptoms initially started after completing antibiotic course after acute COVID illness Complaining of thickness sensation on tongue, roughness and bumps in the back of his throat Tongue will also turn a bright orange color with a film on top of tongue after he eats or drinks anything containing sugar Has completed course of nystatin mouth rinse as well as oral fluconazole Since completing oral fluconazole he feels symptoms have worsened--roughness and bumps to back of throat seem to be getting worse He denies smoking or any use of oral tobacco containing products  Denis any further symptoms such as fevers, headaches, body aches, congestion or allergy symptoms  Current Medication:  Outpatient Encounter Medications as of 02/01/2021  Medication Sig  . nystatin (MYCOSTATIN) 100000 UNIT/ML suspension Take 5 mLs (500,000 Units total) by mouth 4 (four) times daily.  . [DISCONTINUED] fluconazole (DIFLUCAN) 100 MG tablet Take 2-100 mg tabs on day 1, then take one 100 mg tab daily for 7 days (Patient not taking: No sig reported)  . [DISCONTINUED] fluconazole (DIFLUCAN) 150 MG tablet Take 1 tab po once a daily  for 5 days (Patient not taking: Reported on 02/01/2021)   No facility-administered encounter medications on file as of 02/01/2021.      Medical History: History reviewed. No pertinent past medical history.   Vital Signs: BP 122/74   Pulse 63   Temp (!) 97.5 F (36.4 C)   Resp 16   Ht 6\' 9"  (2.057 m)   Wt 238 lb 3.2 oz (108 kg)   SpO2 98%   BMI 25.53 kg/m    Review of Systems   Constitutional: Negative for chills, fatigue and unexpected weight change.  HENT: Negative for congestion, postnasal drip, rhinorrhea, sneezing and sore throat.        Roughness to back of throat Film and color change to tongue  Eyes: Negative for redness.  Respiratory: Negative for cough, chest tightness and shortness of breath.   Cardiovascular: Negative for chest pain and palpitations.  Gastrointestinal: Negative for abdominal pain, constipation, diarrhea, nausea and vomiting.  Genitourinary: Negative for dysuria and frequency.  Musculoskeletal: Negative for arthralgias, back pain, joint swelling and neck pain.  Skin: Negative for rash.  Neurological: Negative for tremors and numbness.  Hematological: Negative for adenopathy. Does not bruise/bleed easily.  Psychiatric/Behavioral: Negative for behavioral problems (Depression), sleep disturbance and suicidal ideas. The patient is not nervous/anxious.     Physical Exam Vitals reviewed.  Constitutional:      Appearance: Normal appearance. He is normal weight.  HENT:     Mouth/Throat:     Tonsils: 2+ on the right.     Comments: Enlarged right tonsil without exudate Mild erythema Cardiovascular:     Rate and Rhythm: Normal rate and regular rhythm.     Pulses: Normal pulses.     Heart sounds: Normal heart sounds.  Pulmonary:     Effort: Pulmonary effort is normal.     Breath sounds: Normal breath sounds.  Musculoskeletal:        General: Normal range of motion.  Cervical back: Normal range of motion.  Skin:    General: Skin is warm.  Neurological:     General: No focal deficit present.     Mental Status: He is alert and oriented to person, place, and time. Mental status is at baseline.  Psychiatric:        Mood and Affect: Mood normal.        Behavior: Behavior normal.        Thought Content: Thought content normal.        Judgment: Judgment normal.    Assessment/Plan: 1. Thrush Symptoms have not resolved with  completing course of nystatin as well as oral fluconazole Referral to ENT for further evaluation and management of ongoing symptoms - Ambulatory referral to ENT  2. Tonsillitis Tonsillitis vs tonsil abscess vs tonsil stone Referral placed to ENT for further evaluation and management - Ambulatory referral to ENT  General Counseling: Scott Freeman understanding of the findings of todays visit and agrees with plan of treatment. I have discussed any further diagnostic evaluation that may be needed or ordered today. We also reviewed his medications today. he has been encouraged to call the office with any questions or concerns that should arise related to todays visit.   Orders Placed This Encounter  Procedures  . Ambulatory referral to ENT    Time spent:25 Minutes Time spent includes review of chart, medications, test results and follow-up plan with the patient.  This patient was seen by Leeanne Deed AGNP-C in Collaboration with Dr Lyndon Code as a part of collaborative care agreement.  Lubertha Basque [facility] Internal Medicine

## 2021-02-01 NOTE — Telephone Encounter (Signed)
LMOM pt needs to know cologuard results are negative

## 2021-02-07 ENCOUNTER — Ambulatory Visit: Payer: 59 | Admitting: Internal Medicine

## 2021-02-10 LAB — COLOGUARD

## 2021-03-08 ENCOUNTER — Ambulatory Visit: Payer: 59 | Admitting: Internal Medicine

## 2023-06-27 ENCOUNTER — Encounter: Payer: Self-pay | Admitting: Nurse Practitioner

## 2023-06-27 ENCOUNTER — Ambulatory Visit: Payer: Self-pay | Admitting: Nurse Practitioner

## 2023-06-27 ENCOUNTER — Other Ambulatory Visit
Admission: RE | Admit: 2023-06-27 | Discharge: 2023-06-27 | Disposition: A | Discharge status: Home / Self Care | Payer: Self-pay | Attending: Nurse Practitioner | Admitting: Nurse Practitioner

## 2023-06-27 VITALS — BP 110/78 | HR 67 | Temp 98.4°F | Resp 16 | Ht >= 80 in | Wt 234.8 lb

## 2023-06-27 DIAGNOSIS — E782 Mixed hyperlipidemia: Secondary | ICD-10-CM

## 2023-06-27 DIAGNOSIS — E538 Deficiency of other specified B group vitamins: Secondary | ICD-10-CM | POA: Insufficient documentation

## 2023-06-27 DIAGNOSIS — E559 Vitamin D deficiency, unspecified: Secondary | ICD-10-CM | POA: Insufficient documentation

## 2023-06-27 DIAGNOSIS — R5383 Other fatigue: Secondary | ICD-10-CM

## 2023-06-27 DIAGNOSIS — H538 Other visual disturbances: Secondary | ICD-10-CM

## 2023-06-27 LAB — CBC WITH DIFFERENTIAL/PLATELET
Abs Immature Granulocytes: 0.01 10*3/uL (ref 0.00–0.07)
Basophils Absolute: 0.1 10*3/uL (ref 0.0–0.1)
Basophils Relative: 1 %
Eosinophils Absolute: 0.2 10*3/uL (ref 0.0–0.5)
Eosinophils Relative: 4 %
HCT: 40.5 % (ref 39.0–52.0)
Hemoglobin: 13.6 g/dL (ref 13.0–17.0)
Immature Granulocytes: 0 %
Lymphocytes Relative: 39 %
Lymphs Abs: 2.3 10*3/uL (ref 0.7–4.0)
MCH: 31.3 pg (ref 26.0–34.0)
MCHC: 33.6 g/dL (ref 30.0–36.0)
MCV: 93.3 fL (ref 80.0–100.0)
Monocytes Absolute: 0.5 10*3/uL (ref 0.1–1.0)
Monocytes Relative: 8 %
Neutro Abs: 2.9 10*3/uL (ref 1.7–7.7)
Neutrophils Relative %: 48 %
Platelets: 246 10*3/uL (ref 150–400)
RBC: 4.34 MIL/uL (ref 4.22–5.81)
RDW: 13.5 % (ref 11.5–15.5)
WBC: 5.8 10*3/uL (ref 4.0–10.5)
nRBC: 0 % (ref 0.0–0.2)

## 2023-06-27 LAB — IRON AND TIBC
Iron: 62 ug/dL (ref 45–182)
Saturation Ratios: 18 % (ref 17.9–39.5)
TIBC: 347 ug/dL (ref 250–450)
UIBC: 285 ug/dL

## 2023-06-27 LAB — LIPID PANEL
Cholesterol: 200 mg/dL (ref 0–200)
HDL: 48 mg/dL (ref >40)
LDL Cholesterol: 136 mg/dL — ABNORMAL HIGH (ref 0–99)
Total CHOL/HDL Ratio: 4.2 RATIO
Triglycerides: 79 mg/dL (ref <150)
VLDL: 16 mg/dL (ref 0–40)

## 2023-06-27 LAB — COMPREHENSIVE METABOLIC PANEL
ALT: 25 U/L (ref 0–44)
AST: 15 U/L (ref 15–41)
Albumin: 4.4 g/dL (ref 3.5–5.0)
Alkaline Phosphatase: 61 U/L (ref 38–126)
Anion gap: 6 (ref 5–15)
BUN: 21 mg/dL — ABNORMAL HIGH (ref 6–20)
CO2: 28 mmol/L (ref 22–32)
Calcium: 9.4 mg/dL (ref 8.9–10.3)
Chloride: 106 mmol/L (ref 98–111)
Creatinine, Ser: 1.06 mg/dL (ref 0.61–1.24)
GFR, Estimated: >60 mL/min (ref >60)
Glucose, Bld: 95 mg/dL (ref 70–99)
Potassium: 4.6 mmol/L (ref 3.5–5.1)
Sodium: 140 mmol/L (ref 135–145)
Total Bilirubin: 0.8 mg/dL (ref 0.3–1.2)
Total Protein: 7.5 g/dL (ref 6.5–8.1)

## 2023-06-27 LAB — VITAMIN D 25 HYDROXY (VIT D DEFICIENCY, FRACTURES): Vit D, 25-Hydroxy: 37.53 ng/mL (ref 30–100)

## 2023-06-27 LAB — FERRITIN: Ferritin: 45 ng/mL (ref 24–336)

## 2023-06-27 LAB — VITAMIN B12: Vitamin B-12: 277 pg/mL (ref 180–914)

## 2023-06-27 LAB — FOLATE: Folate: 7.9 ng/mL (ref >5.9)

## 2023-06-27 NOTE — Progress Notes (Signed)
[facility] [address] [place], [place] [address]  Internal MEDICINE  Office Visit Note  Patient Name: Scott Freeman  [id number]  [id number]  Date of Service: 06/27/2023  Chief Complaint  Patient presents with   Follow-up   Hypertension    Blurry vision     HPI Kayleb presents for a follow-up visit for high BP, blurry vision Does not drink alcohol and stopped eating red meat Had some borderline high BP that was worrisome, feeling more tired lately and blurry vision periodically.  Has not had any labs or other routine screenings done for the past few years.     Current Medication: Outpatient Encounter Medications as of 06/27/2023  Medication Sig   [DISCONTINUED] nystatin (MYCOSTATIN) 100000 UNIT/ML suspension Take 5 mLs (500,000 Units total) by mouth 4 (four) times daily.   No facility-administered encounter medications on file as of 06/27/2023.    Surgical History: History reviewed. No pertinent surgical history.  Medical History: History reviewed. No pertinent past medical history.  Family History: Family History  Problem Relation Age of Onset   Diabetes Mother    Pancreatic cancer Father     Social History   Socioeconomic History   Marital status: Married    Spouse name: Not on file   Number of children: Not on file   Years of education: Not on file   Highest education level: Not on file  Occupational History   Not on file  Tobacco Use   Smoking status: Never   Smokeless tobacco: Never  Vaping Use   Vaping status: Never Used  Substance and Sexual Activity   Alcohol use: Yes    Comment: occasionally    Drug use: Never   Sexual activity: Not on file  Other Topics Concern   Not on file  Social History Narrative   Independent at baseline   Social Determinants of Health   Financial Resource Strain: Not on file  Food Insecurity: Not on file  Transportation Needs: Not on file  Physical Activity: Not on file  Stress: Not on file  Social  Connections: Not on file  Intimate Partner Violence: Not on file      Review of Systems  Constitutional:  Positive for fatigue. Negative for chills and unexpected weight change.  HENT:  Negative for congestion, postnasal drip, rhinorrhea, sneezing and sore throat.   Eyes:  Positive for visual disturbance.  Respiratory: Negative.  Negative for cough, chest tightness, shortness of breath and wheezing.   Cardiovascular: Negative.  Negative for chest pain and palpitations.  Gastrointestinal: Negative.  Negative for abdominal pain, constipation, diarrhea, nausea and vomiting.  Musculoskeletal:  Negative for arthralgias, back pain, joint swelling and neck pain.  Skin:  Negative for rash.  Neurological: Negative.  Negative for tremors and numbness.  Hematological:  Negative for adenopathy. Does not bruise/bleed easily.  Psychiatric/Behavioral:  Negative for behavioral problems (Depression), sleep disturbance and suicidal ideas. The patient is not nervous/anxious.     Vital Signs: BP 110/78 Comment: 130/84  Pulse 67   Temp 98.4 F (36.9 C)   Resp 16   Ht 6\' 9"  (2.057 m)   Wt 234 lb 12.8 oz (106.5 kg)   SpO2 97%   BMI 25.16 kg/m    Physical Exam Vitals reviewed.  Constitutional:      General: He is not in acute distress.    Appearance: Normal appearance. He is not ill-appearing.  HENT:     Head: Normocephalic and atraumatic.  Eyes:  Pupils: Pupils are equal, round, and reactive to light.  Cardiovascular:     Rate and Rhythm: Normal rate and regular rhythm.  Pulmonary:     Effort: Pulmonary effort is normal. No respiratory distress.  Neurological:     Mental Status: He is alert and oriented to person, place, and time.  Psychiatric:        Mood and Affect: Mood normal.        Behavior: Behavior normal.        Assessment/Plan: 1. Other fatigue Routine labs ordered - CBC with Differential/Platelet - CMP14+EGFR - Lipid Profile - B12 and Folate Panel - Vitamin D  (25 hydroxy) - Iron, TIBC and Ferritin Panel  2. Blurry vision Routine labs ordered  - CBC with Differential/Platelet - CMP14+EGFR - Lipid Profile - B12 and Folate Panel - Vitamin D (25 hydroxy) - Iron, TIBC and Ferritin Panel  3. Mixed hyperlipidemia Routine labs ordered  - CBC with Differential/Platelet - CMP14+EGFR - Lipid Profile - B12 and Folate Panel - Vitamin D (25 hydroxy) - Iron, TIBC and Ferritin Panel  4. B12 deficiency Routine labs ordered - CBC with Differential/Platelet - CMP14+EGFR - Lipid Profile - B12 and Folate Panel - Vitamin D (25 hydroxy) - Iron, TIBC and Ferritin Panel  5. Vitamin D deficiency Routine labs ordered  - CBC with Differential/Platelet - CMP14+EGFR - Lipid Profile - B12 and Folate Panel - Vitamin D (25 hydroxy) - Iron, TIBC and Ferritin Panel   General Counseling: Aldean Jewett understanding of the findings of todays visit and agrees with plan of treatment. I have discussed any further diagnostic evaluation that may be needed or ordered today. We also reviewed his medications today. he has been encouraged to call the office with any questions or concerns that should arise related to todays visit.    Orders Placed This Encounter  Procedures   CBC with Differential/Platelet   CMP14+EGFR   Lipid Profile   B12 and Folate Panel   Vitamin D (25 hydroxy)   Iron, TIBC and Ferritin Panel    No orders of the defined types were placed in this encounter.   Return if symptoms worsen or fail to improve, for will call with results .   Total time spent:30 Minutes Time spent includes review of chart, medications, test results, and follow up plan with the patient.   [place] Controlled Substance Database was reviewed by me.  This patient was seen by Sallyanne Kuster, FNP-C in collaboration with Dr. Beverely Risen as a part of collaborative care agreement.   Koehn Salehi R. Tedd Sias, MSN, FNP-C Internal medicine

## 2023-08-11 NOTE — Progress Notes (Signed)
Low b12 at 277, Recommend monthly B12 injection or take OTC supplement 1000 mcg daily.  High LDL cholesterol at 136, please limit red meat to 3 times a week or less, and increase lean proteins in diet, please send low cholesterol diet information to patient  All other labs were normal.

## 2023-08-13 ENCOUNTER — Telehealth: Payer: Self-pay

## 2023-08-13 NOTE — Telephone Encounter (Signed)
Left message for patient to giver office a call.

## 2023-08-15 ENCOUNTER — Telehealth: Payer: Self-pay

## 2023-08-15 NOTE — Telephone Encounter (Signed)
done

## 2023-08-15 NOTE — Telephone Encounter (Signed)
Patient wife called back and was given the information.

## 2023-08-15 NOTE — Telephone Encounter (Signed)
-----   Message from [facility] sent at 08/11/2023  9:40 AM EST ----- Low b12 at 277, Recommend monthly B12 injection or take OTC supplement 1000 mcg daily.  High LDL cholesterol at 136, please limit red meat to 3 times a week or less, and increase lean proteins in diet, please send low cholesterol diet information to patient  All other labs were normal.

## 2023-08-20 ENCOUNTER — Ambulatory Visit: Payer: Self-pay

## 2023-08-20 DIAGNOSIS — E538 Deficiency of other specified B group vitamins: Secondary | ICD-10-CM

## 2023-08-20 MED ORDER — CYANOCOBALAMIN 1000 MCG/ML IJ SOLN
1000.0000 ug | Freq: Once | INTRAMUSCULAR | Status: AC
Start: 2023-08-20 — End: 2023-08-20
  Administered 2023-08-20: 1000 ug via INTRAMUSCULAR

## 2023-08-27 ENCOUNTER — Ambulatory Visit: Payer: Self-pay

## 2023-08-29 ENCOUNTER — Ambulatory Visit: Payer: Self-pay

## 2023-08-29 DIAGNOSIS — E538 Deficiency of other specified B group vitamins: Secondary | ICD-10-CM

## 2023-08-29 MED ORDER — CYANOCOBALAMIN 1000 MCG/ML IJ SOLN
1000.0000 ug | Freq: Once | INTRAMUSCULAR | Status: AC
Start: 2023-08-29 — End: 2023-08-29
  Administered 2023-08-29: 1000 ug via INTRAMUSCULAR
